# Patient Record
Sex: Female | Born: 1965
Health system: Southern US, Community
[De-identification: ages and names within clinical notes are randomized; demographics above are authoritative.]

## PROBLEM LIST (undated history)

## (undated) DIAGNOSIS — N189 Chronic kidney disease, unspecified: Secondary | ICD-10-CM

## (undated) DIAGNOSIS — D496 Neoplasm of unspecified behavior of brain: Secondary | ICD-10-CM

## (undated) DIAGNOSIS — E785 Hyperlipidemia, unspecified: Secondary | ICD-10-CM

## (undated) DIAGNOSIS — G4733 Obstructive sleep apnea (adult) (pediatric): Secondary | ICD-10-CM

## (undated) DIAGNOSIS — I10 Essential (primary) hypertension: Secondary | ICD-10-CM

## (undated) DIAGNOSIS — L570 Actinic keratosis: Secondary | ICD-10-CM

## (undated) DIAGNOSIS — C4492 Squamous cell carcinoma of skin, unspecified: Secondary | ICD-10-CM

## (undated) DIAGNOSIS — F419 Anxiety disorder, unspecified: Secondary | ICD-10-CM

## (undated) HISTORY — PX: CHOLECYSTECTOMY: SHX55

## (undated) HISTORY — DX: Squamous cell carcinoma of skin, unspecified: C44.92

## (undated) HISTORY — DX: Actinic keratosis: L57.0

## (undated) HISTORY — PX: WISDOM TOOTH EXTRACTION: SHX21

## (undated) HISTORY — PX: HX TUBAL LIGATION: SHX77

## (undated) HISTORY — DX: Hyperlipidemia, unspecified: E78.5

## (undated) HISTORY — PX: HX HYSTERECTOMY: SHX81

## (undated) HISTORY — PX: HX GALL BLADDER SURGERY/CHOLE: SHX55

## (undated) HISTORY — PX: BRAIN TUMOR EXCISION: SHX577

## (undated) HISTORY — PX: HX BREAST BIOPSY: SHX20

## (undated) HISTORY — DX: Obstructive sleep apnea (adult) (pediatric): G47.33

## (undated) HISTORY — DX: Neoplasm of unspecified behavior of brain: D49.6

## (undated) HISTORY — DX: Chronic kidney disease, unspecified: N18.9

## (undated) HISTORY — DX: Essential (primary) hypertension: I10

---

## 2000-06-21 ENCOUNTER — Other Ambulatory Visit: Admission: RE | Admit: 2000-06-21 | Discharge: 2000-06-21 | Payer: Self-pay | Admitting: *Deleted

## 2001-01-20 ENCOUNTER — Inpatient Hospital Stay (HOSPITAL_COMMUNITY): Admission: AD | Admit: 2001-01-20 | Discharge: 2001-01-22 | Payer: Self-pay | Admitting: *Deleted

## 2001-03-01 ENCOUNTER — Other Ambulatory Visit: Admission: RE | Admit: 2001-03-01 | Discharge: 2001-03-01 | Payer: Self-pay | Admitting: *Deleted

## 2001-03-14 ENCOUNTER — Ambulatory Visit (HOSPITAL_COMMUNITY): Payer: Self-pay

## 2003-08-29 ENCOUNTER — Inpatient Hospital Stay (HOSPITAL_COMMUNITY): Admission: AD | Admit: 2003-08-29 | Discharge: 2003-08-31 | Payer: Self-pay | Admitting: *Deleted

## 2003-09-28 ENCOUNTER — Encounter: Payer: Self-pay | Admitting: Family Medicine

## 2003-09-28 ENCOUNTER — Emergency Department (HOSPITAL_COMMUNITY): Admission: AD | Admit: 2003-09-28 | Discharge: 2003-09-28 | Payer: Self-pay | Admitting: Family Medicine

## 2003-09-28 ENCOUNTER — Ambulatory Visit (HOSPITAL_COMMUNITY): Admission: RE | Admit: 2003-09-28 | Discharge: 2003-09-28 | Payer: Self-pay | Admitting: Family Medicine

## 2003-10-02 ENCOUNTER — Other Ambulatory Visit: Admission: RE | Admit: 2003-10-02 | Discharge: 2003-10-02 | Payer: Self-pay | Admitting: *Deleted

## 2004-02-19 ENCOUNTER — Encounter (INDEPENDENT_AMBULATORY_CARE_PROVIDER_SITE_OTHER): Payer: Self-pay | Admitting: Specialist

## 2004-02-19 ENCOUNTER — Ambulatory Visit (HOSPITAL_COMMUNITY): Admission: RE | Admit: 2004-02-19 | Discharge: 2004-02-20 | Payer: Self-pay | Admitting: Surgery

## 2005-03-09 ENCOUNTER — Other Ambulatory Visit: Admission: RE | Admit: 2005-03-09 | Discharge: 2005-03-09 | Payer: Self-pay | Admitting: Obstetrics and Gynecology

## 2005-04-09 ENCOUNTER — Encounter: Admission: RE | Admit: 2005-04-09 | Discharge: 2005-04-09 | Payer: Self-pay | Admitting: Surgery

## 2005-04-14 ENCOUNTER — Ambulatory Visit: Payer: Self-pay | Admitting: Family Medicine

## 2005-05-24 ENCOUNTER — Ambulatory Visit: Payer: Self-pay | Admitting: Family Medicine

## 2009-11-27 ENCOUNTER — Inpatient Hospital Stay (HOSPITAL_COMMUNITY): Admission: AD | Admit: 2009-11-27 | Discharge: 2009-11-30 | Payer: Self-pay | Admitting: Obstetrics and Gynecology

## 2010-11-12 ENCOUNTER — Ambulatory Visit: Payer: Self-pay | Admitting: Family Medicine

## 2011-01-12 NOTE — Assessment & Plan Note (Signed)
Summary: FLU MIST/EVM  Nurse Visit   Immunizations Administered:  Influenza Vaccine:    Vaccine Type: FLULAVAL    Site: right deltoid    Mfr: GlaxoSmithKline    Dose: 0.5 ml    Route: IM    Given by: Levonne Spiller EMT-P    Exp. Date: 06/12/2011    Lot #: [id number]    VIS given: 07/07/10 version given November 12, 2010.   Immunizations Administered:  Influenza Vaccine:    Vaccine Type: FLULAVAL    Site: right deltoid    Mfr: GlaxoSmithKline    Dose: 0.5 ml    Route: IM    Given by: Levonne Spiller EMT-P    Exp. Date: 06/12/2011    Lot #: [id number]    VIS given: 07/07/10 version given November 12, 2010.  Flu Vaccine Consent Questions:    Do you have a history of severe allergic reactions to this vaccine? no    Any prior history of allergic reactions to egg and/or gelatin? no    Do you have a sensitivity to the preservative Thimersol? no    Do you have a past history of Guillan-Barre Syndrome? no    Do you currently have an acute febrile illness? no    Have you ever had a severe reaction to latex? no    Vaccine information given and explained to patient? yes    Are you currently pregnant? no

## 2011-03-15 LAB — COMPREHENSIVE METABOLIC PANEL
ALT: 13 U/L (ref 0–35)
AST: 16 U/L (ref 0–37)
Albumin: 3 g/dL — ABNORMAL LOW (ref 3.5–5.2)
Alkaline Phosphatase: 137 U/L — ABNORMAL HIGH (ref 39–117)
BUN: 12 mg/dL (ref 6–23)
CO2: 22 mEq/L (ref 19–32)
Calcium: 8.9 mg/dL (ref 8.4–10.5)
Chloride: 105 mEq/L (ref 96–112)
Creatinine, Ser: 0.64 mg/dL (ref 0.4–1.2)
GFR calc Af Amer: >60 mL/min (ref >60)
GFR calc non Af Amer: >60 mL/min (ref >60)
Glucose, Bld: 81 mg/dL (ref 70–99)
Potassium: 4.3 mEq/L (ref 3.5–5.1)
Sodium: 134 mEq/L — ABNORMAL LOW (ref 135–145)
Total Bilirubin: 0.3 mg/dL (ref 0.3–1.2)
Total Protein: 6.3 g/dL (ref 6.0–8.3)

## 2011-03-15 LAB — CBC
HCT: 33.9 % — ABNORMAL LOW (ref 36.0–46.0)
Hemoglobin: 11.2 g/dL — ABNORMAL LOW (ref 12.0–15.0)
Hemoglobin: 11.6 g/dL — ABNORMAL LOW (ref 12.0–15.0)
MCHC: 34.1 g/dL (ref 30.0–36.0)
MCHC: 34.5 g/dL (ref 30.0–36.0)
MCV: 99.3 fL (ref 78.0–100.0)
Platelets: 345 10*3/uL (ref 150–400)
RBC: 3.25 MIL/uL — ABNORMAL LOW (ref 3.87–5.11)
RBC: 3.41 MIL/uL — ABNORMAL LOW (ref 3.87–5.11)
RDW: 13 % (ref 11.5–15.5)
WBC: 15 10*3/uL — ABNORMAL HIGH (ref 4.0–10.5)

## 2011-03-15 LAB — URIC ACID: Uric Acid, Serum: 5 mg/dL (ref 2.4–7.0)

## 2011-03-15 LAB — LACTATE DEHYDROGENASE: LDH: 112 U/L (ref 94–250)

## 2011-03-15 LAB — RPR: RPR Ser Ql: NONREACTIVE

## 2011-04-30 NOTE — Op Note (Signed)
[facility] [place]  Patient:    Kathleen Hull, Kathleen Hull                 MRN: [id number] Adm. Date:  [id number] Attending:  Minette Headland                           Operative Report  DELIVERY NOTE  HISTORY:                      The patient had a second stage of between an hour and 45 minutes to two hours. She was known to be OA at +3 station with an epidural and stable fetal heart rate. Due to maternal exhaustion, the decision was made to proceed with VE assisted delivery.  DESCRIPTION OF PROCEDURE:     She was prepped and draped. The Foley catheter was removed. Tender touch VE was applied. Traction efforts were coordinated with maternal pushing x 3 efforts with pressure released in between. This resulted in easy delivery of the vertex over midline episiotomy with local and epidural. The shoulders were delivered easily; a healthy female, Apgars 9 and 9, pH pending. The placenta then delivered spontaneously, intact. Pitocin was given IV at that point. Inspection revealed the cervix and upper vagina to be intact. There was a partial third-degree extension. The rectal mucosa was intact. This was repaired in standard layer fashion with 3-0 Vicryl Rapide sutures. EBL was 350 to 400 cc. Mother and baby were doing well at that point. Of note, earlier during labor, catheterized UA was sent with no proteinuria noted. DD:  01/21/01 TD:  01/22/01 Job: [id number]

## 2011-04-30 NOTE — Op Note (Signed)
NAME:  Kathleen Hull, Kathleen Hull                         ACCOUNT NO.:  [id number]   MEDICAL RECORD NO.:  [id number]                   PATIENT TYPE:  OIB   LOCATION:  6120                                 FACILITY:  [facility]   PHYSICIAN:  Kathleen Hull, M.D.             DATE OF BIRTH:  1965/12/23   DATE OF PROCEDURE:  02/19/2004  DATE OF DISCHARGE:                                 OPERATIVE REPORT   PREOPERATIVE DIAGNOSIS:  Chronic cholecystitis and cholelithiasis.   POSTOPERATIVE DIAGNOSIS:  Chronic cholecystitis and cholelithiasis.   OPERATION PERFORMED:  Laparoscopic cholecystectomy with intraoperative  cholangiogram.   SURGEON:  Kathleen Hull, M.D.   ASSISTANT:  Kathleen Hull, M.D.   ANESTHESIA:  General endotracheal.   DESCRIPTION OF PROCEDURE:  Kathleen Hull was taken to room 16 on February 19, 2004 and given general anesthesia.  The abdomen was prepped with Betadine  and draped sterilely.  Longitudinal incision was made down to the umbilicus  and we dissected down onto the fascia and opened this with a linear incision  without difficulty, placing the Hasson cannula.  The abdomen was  insufflated.  The other port sites were injected with Marcaine and entered  without difficulty.  The gallbladder was grasped and elevated.  She had  numerous chronic adhesions to the infundibulum as well as the fundus of the  gallbladder which were stripped away.  I then dissected out the Calot's  triangle and found the beginning of the cystic duct which seemed to be  fairly long.  I stayed up on the gallbladder however, and dissected that  free and then put a clip upon the gallbladder and took a cholangiogram the  first time with some extravasation.  I had to replace my catheter and put a  clip upon it and then got a good view of the common duct with intrahepatic  filling and prompt flow into the duodenum.  No evidence of stones.  Next, I  triple clipped the cystic duct, divided it and then clipped  the cystic  artery twice and divided it.  I got some back-bleeding.  I then moved up the  gallbladder bed using the hook without entering the gallbladder and  maintained hemostasis all the way up to the top.  There was some oozing down  where I had clipped the arteries.  I went back and put a couple of more  clips and cauterized the area and it seemed to control the bleeding and no  further bleeding was noted.  The gallbladder was detached and brought out  through the umbilicus.  I had to remove the stones which were many and  multifaceted but got it out.  There was no spillage of bile or stones in the  process of removing the gallbladder.  I then went back up and irrigated the  gallbladder bed and looked at it and no bleeding was noted and no bile leaks  were seen.  Irrigant was withdrawn.  I then repaired the umbilical defect  with a simple suture that I placed plus a figure-of-eight suture down below.  These were tied and I completed the closure.  We visualized this with the  scope in  place.  I then visualized this.  The two 5 mm ports were withdrawn.  The  abdomen was deflated.  The port sites were injected with Marcaine and were  closed with 4-0 Vicryl, benzoin and Steri-Strips.  The patient seemed to  tolerate the procedure well and was taken to the recovery room in  satisfactory condition.                                               Kathleen Park Daphine Hull, M.D.    MBM/MEDQ  D:  02/19/2004  T:  02/19/2004  Job:  [id number]   cc:   Kathleen Hull, M.D.  [address] [place]  [place] [address]  Fax: [phone]   [facility]

## 2011-12-14 DIAGNOSIS — C4492 Squamous cell carcinoma of skin, unspecified: Secondary | ICD-10-CM

## 2011-12-14 HISTORY — DX: Squamous cell carcinoma of skin, unspecified: C44.92

## 2012-06-24 LAB — HM PAP SMEAR: HM Pap smear: NORMAL

## 2013-02-22 ENCOUNTER — Encounter: Payer: Self-pay | Admitting: Internal Medicine

## 2013-02-22 ENCOUNTER — Ambulatory Visit (INDEPENDENT_AMBULATORY_CARE_PROVIDER_SITE_OTHER): Payer: Managed Care, Other (non HMO) | Admitting: Internal Medicine

## 2013-02-22 VITALS — BP 124/90 | HR 75 | Temp 98.2°F | Ht 64.0 in | Wt 188.0 lb

## 2013-02-22 DIAGNOSIS — Z1239 Encounter for other screening for malignant neoplasm of breast: Secondary | ICD-10-CM | POA: Insufficient documentation

## 2013-02-22 DIAGNOSIS — E669 Obesity, unspecified: Secondary | ICD-10-CM | POA: Insufficient documentation

## 2013-02-22 DIAGNOSIS — J069 Acute upper respiratory infection, unspecified: Secondary | ICD-10-CM | POA: Insufficient documentation

## 2013-02-22 NOTE — Assessment & Plan Note (Signed)
Will set up screening mammogram. 

## 2013-02-22 NOTE — Assessment & Plan Note (Signed)
Symptoms most consistent with viral upper respiratory infection. Encourage to rest, increase fluids, and use of nonsedating antihistamine such as Claritin. Sudafed as needed for nasal congestion. Patient will call if symptoms are worsening or not improving over the next 48 hours.

## 2013-02-22 NOTE — Progress Notes (Signed)
Subjective:    Patient ID: Kathleen Hull, female    DOB: [birth year], 47 y.o.   MRN: [id number]  HPI 47 year old female presents to establish care. She reports she has been generally healthy. Over the last couple of days she has had some clear nasal congestion, and occasional dry cough. She denies any fever or chills. She denies any shortness of breath or chest pain. She has not been taking any medication for this.  She would like to schedule screening mammogram. She reports that after she completed breast-feeding with her third child she did not schedule screening mammogram. This will be her first mammogram. She denies any pain in her breast or concerns.  She follows a healthy diet and has recently lost >40lbs by following healthy diet and getting regular exercise by walking. She notes that she would like to lose another 10-20lbs.  No outpatient encounter prescriptions on file as of 02/22/2013.   No facility-administered encounter medications on file as of 02/22/2013.   BP 124/90  Pulse 75  Temp(Src) 98.2 F (36.8 C) (Oral)  Ht 5\' 4"  (1.626 m)  Wt 188 lb (85.276 kg)  BMI 32.25 kg/m2  SpO2 99%  LMP 01/30/2013  Review of Systems  Constitutional: Negative for fever, chills, appetite change, fatigue and unexpected weight change.  HENT: Positive for rhinorrhea and postnasal drip. Negative for ear pain, congestion, sore throat, trouble swallowing, neck pain, voice change and sinus pressure.   Eyes: Negative for visual disturbance.  Respiratory: Positive for cough. Negative for shortness of breath, wheezing and stridor.   Cardiovascular: Negative for chest pain, palpitations and leg swelling.  Gastrointestinal: Negative for nausea, vomiting, abdominal pain, diarrhea, constipation, blood in stool, abdominal distention and anal bleeding.  Genitourinary: Negative for dysuria and flank pain.  Musculoskeletal: Negative for myalgias, arthralgias and gait problem.  Skin: Negative for color change  and rash.  Neurological: Negative for dizziness and headaches.  Hematological: Negative for adenopathy. Does not bruise/bleed easily.  Psychiatric/Behavioral: Negative for suicidal ideas, sleep disturbance and dysphoric mood. The patient is not nervous/anxious.        Objective:   Physical Exam  Constitutional: She is oriented to person, place, and time. She appears well-developed and well-nourished. No distress.  HENT:  Head: Normocephalic and atraumatic.  Right Ear: External ear normal.  Left Ear: External ear normal.  Nose: Nose normal.  Mouth/Throat: Posterior oropharyngeal erythema present. No oropharyngeal exudate.  Eyes: Conjunctivae are normal. Pupils are equal, round, and reactive to light. Right eye exhibits no discharge. Left eye exhibits no discharge. No scleral icterus.  Neck: Normal range of motion. Neck supple. No tracheal deviation present. No thyromegaly present.  Cardiovascular: Normal rate, regular rhythm, normal heart sounds and intact distal pulses.  Exam reveals no gallop and no friction rub.   No murmur heard. Pulmonary/Chest: Effort normal and breath sounds normal. No respiratory distress. She has no wheezes. She has no rales. She exhibits no tenderness.  Abdominal: Soft. Bowel sounds are normal. She exhibits no distension. There is no tenderness.  Musculoskeletal: Normal range of motion. She exhibits no edema and no tenderness.  Lymphadenopathy:    She has no cervical adenopathy.  Neurological: She is alert and oriented to person, place, and time. No cranial nerve deficit. She exhibits normal muscle tone. Coordination normal.  Skin: Skin is warm and dry. No rash noted. She is not diaphoretic. No erythema. No pallor.  Psychiatric: She has a normal mood and affect. Her behavior is normal. Judgment and thought content  normal.          Assessment & Plan:

## 2013-02-22 NOTE — Assessment & Plan Note (Signed)
Body mass index is 32.25 kg/(m^2). Congratulated patient on recent weight loss. Encouraged her to continue efforts at healthy diet and regular physical activity.

## 2013-03-19 ENCOUNTER — Ambulatory Visit
Admission: RE | Admit: 2013-03-19 | Discharge: 2013-03-19 | Disposition: A | Discharge status: Home / Self Care | Payer: Managed Care, Other (non HMO) | Source: Ambulatory Visit | Attending: Internal Medicine | Admitting: Internal Medicine

## 2013-03-19 DIAGNOSIS — Z1239 Encounter for other screening for malignant neoplasm of breast: Secondary | ICD-10-CM

## 2013-03-20 ENCOUNTER — Encounter: Payer: Self-pay | Admitting: *Deleted

## 2013-05-09 ENCOUNTER — Encounter: Payer: Self-pay | Admitting: Internal Medicine

## 2013-05-09 ENCOUNTER — Telehealth: Payer: Self-pay | Admitting: Internal Medicine

## 2013-05-09 NOTE — Telephone Encounter (Signed)
Patient Information:  Caller Name: Jerolene  Phone: [phone]  Patient: Kathleen Hull, Kathleen Hull  Gender: Female  DOB: [birth year]  Age: 47 Years  PCP: Ronna Polio (Adults only)  Pregnant: No  Office Follow Up:  Does the office need to follow up with this patient?: No  Instructions For The Office: N/A   Symptoms  Reason For Call & Symptoms: pt reports that she went to pick strawberries the same day.  Rash is on chest and beginning to spread to arms  Reviewed Health History In EMR: Yes  Reviewed Medications In EMR: Yes  Reviewed Allergies In EMR: Yes  Reviewed Surgeries / Procedures: Yes  Date of Onset of Symptoms: 05/07/2013  Treatments Tried: Benadryl, Hydrocortisone  Treatments Tried Worked: No OB / GYN:  LMP: 05/02/2013  Guideline(s) Used:  Rash or Redness - Widespread  Disposition Per Guideline:   See Today or Tomorrow in Office  Reason For Disposition Reached:   Mild widespread rash  Advice Given:  Oatmeal Aveeno Bath for Itching:  Sprinkle contents of one Aveeno packet under running faucet with comfortably warm water. Bathe for 15 - 20 minutes, 1-2 times daily.  Oral Antihistamine Medication for Itching:  Take an antihistamine like diphenhydramine (Benadryl) for widespread rashes that itch. The adult dosage of Benadryl is 25-50 mg by mouth 4 times daily.  Call Back If:   You become worse  Patient Will Follow Care Advice:  YES  Appointment Scheduled:  05/10/2013 09:45:00 Appointment Scheduled Provider:  Ronna Polio (Adults only)

## 2013-05-09 NOTE — Telephone Encounter (Signed)
Fwd to Dr. Walker 

## 2013-05-10 ENCOUNTER — Ambulatory Visit: Payer: Self-pay | Admitting: Internal Medicine

## 2013-06-06 LAB — HM PAP SMEAR: HM Pap smear: NEGATIVE

## 2013-07-27 ENCOUNTER — Encounter: Payer: Self-pay | Admitting: Internal Medicine

## 2013-07-30 ENCOUNTER — Encounter: Payer: Managed Care, Other (non HMO) | Admitting: Internal Medicine

## 2013-08-21 ENCOUNTER — Encounter: Payer: Managed Care, Other (non HMO) | Admitting: Internal Medicine

## 2013-08-29 ENCOUNTER — Encounter: Payer: Managed Care, Other (non HMO) | Admitting: Internal Medicine

## 2013-09-11 ENCOUNTER — Encounter: Payer: Self-pay | Admitting: Internal Medicine

## 2013-09-11 ENCOUNTER — Ambulatory Visit (INDEPENDENT_AMBULATORY_CARE_PROVIDER_SITE_OTHER): Payer: Managed Care, Other (non HMO) | Admitting: Internal Medicine

## 2013-09-11 ENCOUNTER — Other Ambulatory Visit (HOSPITAL_COMMUNITY)
Admission: RE | Admit: 2013-09-11 | Discharge: 2013-09-11 | Disposition: A | Discharge status: Home / Self Care | Payer: Managed Care, Other (non HMO) | Source: Ambulatory Visit | Attending: Internal Medicine | Admitting: Internal Medicine

## 2013-09-11 VITALS — BP 120/80 | HR 56 | Temp 98.3°F | Ht 64.0 in | Wt 175.0 lb

## 2013-09-11 DIAGNOSIS — Z01419 Encounter for gynecological examination (general) (routine) without abnormal findings: Secondary | ICD-10-CM | POA: Insufficient documentation

## 2013-09-11 DIAGNOSIS — Z1151 Encounter for screening for human papillomavirus (HPV): Secondary | ICD-10-CM | POA: Insufficient documentation

## 2013-09-11 DIAGNOSIS — Z0000 Encounter for general adult medical examination without abnormal findings: Secondary | ICD-10-CM | POA: Insufficient documentation

## 2013-09-11 DIAGNOSIS — N3941 Urge incontinence: Secondary | ICD-10-CM | POA: Insufficient documentation

## 2013-09-11 DIAGNOSIS — Z23 Encounter for immunization: Secondary | ICD-10-CM

## 2013-09-11 DIAGNOSIS — C44529 Squamous cell carcinoma of skin of other part of trunk: Secondary | ICD-10-CM | POA: Insufficient documentation

## 2013-09-11 NOTE — Progress Notes (Signed)
Subjective:    Patient ID: MERLIN EGE, female    DOB: [birth year], 47 y.o.   MRN: [id number]  HPI 47YO female presents for annual exam. Her primary concern today is pain at recent excision site of SCC over her lower sternum. This was performed 2 weeks ago by her dermatologist. She reports that she had significant bleeding from the site after the procedure, soaking through several pads. She was able to stop the bleeding with an over-the-counter coagulant. She also reports significant pain at the site. She has been applying Vaseline daily. She denies any fever or chills. She would like to get her sutures removed today.  Aside from this, she is doing well. She notes some variation in her menstrual cycles with lighter cycles alternating with heavier cycles. She also notes some occasional urinary urgency and leakage of small amounts of urine. She has been wearing a small pad to help with this. She denies any dysuria, hematuria, fever, chills, flank pain.  No outpatient encounter prescriptions on file as of 09/11/2013.   No facility-administered encounter medications on file as of 09/11/2013.   BP 120/80  Pulse 56  Temp(Src) 98.3 F (36.8 C) (Oral)  Ht 5\' 4"  (1.626 m)  Wt 175 lb (79.379 kg)  BMI 30.02 kg/m2  SpO2 97%  LMP 09/04/2013  Review of Systems  Constitutional: Negative for fever, chills, appetite change, fatigue and unexpected weight change.  HENT: Negative for ear pain, congestion, sore throat, trouble swallowing, neck pain, voice change and sinus pressure.   Eyes: Negative for visual disturbance.  Respiratory: Negative for cough, shortness of breath, wheezing and stridor.   Cardiovascular: Positive for chest pain (chest wall pain). Negative for palpitations and leg swelling.  Gastrointestinal: Negative for nausea, vomiting, abdominal pain, diarrhea, constipation, blood in stool, abdominal distention and anal bleeding.  Genitourinary: Positive for urgency. Negative for dysuria and  flank pain.  Musculoskeletal: Negative for myalgias, arthralgias and gait problem.  Skin: Negative for color change and rash.  Neurological: Negative for dizziness and headaches.  Hematological: Negative for adenopathy. Does not bruise/bleed easily.  Psychiatric/Behavioral: Negative for suicidal ideas, sleep disturbance and dysphoric mood. The patient is not nervous/anxious.        Objective:   Physical Exam  Constitutional: She is oriented to person, place, and time. She appears well-developed and well-nourished. No distress.  HENT:  Head: Normocephalic and atraumatic.  Right Ear: External ear normal.  Left Ear: External ear normal.  Nose: Nose normal.  Mouth/Throat: Oropharynx is clear and moist. No oropharyngeal exudate.  Eyes: Conjunctivae are normal. Pupils are equal, round, and reactive to light. Right eye exhibits no discharge. Left eye exhibits no discharge. No scleral icterus.  Neck: Normal range of motion. Neck supple. No tracheal deviation present. No thyromegaly present.  Cardiovascular: Normal rate, regular rhythm, normal heart sounds and intact distal pulses.  Exam reveals no gallop and no friction rub.   No murmur heard. Pulmonary/Chest: Effort normal and breath sounds normal. No accessory muscle usage. Not tachypneic. No respiratory distress. She has no decreased breath sounds. She has no wheezes. She has no rhonchi. She has no rales. She exhibits no tenderness.    Abdominal: Soft. Bowel sounds are normal. She exhibits no distension and no mass. There is no tenderness. There is no rebound and no guarding.  Genitourinary: Rectum normal, vagina normal and uterus normal. No breast swelling, tenderness, discharge or bleeding. Pelvic exam was performed with patient supine. There is no rash, tenderness or lesion on the  right labia. There is no rash, tenderness or lesion on the left labia. Uterus is not enlarged and not tender. Cervix exhibits no motion tenderness, no discharge and  no friability. Right adnexum displays no mass, no tenderness and no fullness. Left adnexum displays no mass, no tenderness and no fullness. No erythema or tenderness around the vagina. No vaginal discharge found.  Musculoskeletal: Normal range of motion. She exhibits no edema and no tenderness.  Lymphadenopathy:    She has no cervical adenopathy.  Neurological: She is alert and oriented to person, place, and time. No cranial nerve deficit. She exhibits normal muscle tone. Coordination normal.  Skin: Skin is warm and dry. No rash noted. She is not diaphoretic. No erythema. No pallor.  Psychiatric: She has a normal mood and affect. Her behavior is normal. Judgment and thought content normal.          Assessment & Plan:

## 2013-09-11 NOTE — Assessment & Plan Note (Signed)
S/p resection with Dr. Cheree Ditto. Will request notes on this. Sutures removed today. Persistent pain at site. Site is macerated. Encouraged pt to keep area dry. Applied Telfa dressing. Follow up prn. Will also set up dermatology evaluation for overall skin check.

## 2013-09-11 NOTE — Assessment & Plan Note (Signed)
General medical exam including breast and pelvic exam normal today. PAP is pending. Will check labs including CBC, CMP, lipids, TSH. Encouraged healthy diet and regular physical activity. Mammogram ordered. Follow up 1 year and prn.

## 2013-09-11 NOTE — Assessment & Plan Note (Signed)
Symptoms consistent with urge incontinence. Discussed medication to help control symptoms. Pt would prefer to hold off for now. Will continue to monitor.

## 2014-09-27 ENCOUNTER — Other Ambulatory Visit: Payer: Self-pay

## 2015-07-07 ENCOUNTER — Encounter: Payer: Self-pay | Admitting: Internal Medicine

## 2015-07-07 ENCOUNTER — Ambulatory Visit (INDEPENDENT_AMBULATORY_CARE_PROVIDER_SITE_OTHER): Payer: Managed Care, Other (non HMO) | Admitting: Internal Medicine

## 2015-07-07 ENCOUNTER — Other Ambulatory Visit: Payer: Self-pay | Admitting: Internal Medicine

## 2015-07-07 VITALS — BP 119/83 | HR 75 | Temp 97.9°F | Ht 64.25 in | Wt 224.2 lb

## 2015-07-07 DIAGNOSIS — Z0000 Encounter for general adult medical examination without abnormal findings: Secondary | ICD-10-CM | POA: Diagnosis not present

## 2015-07-07 DIAGNOSIS — E669 Obesity, unspecified: Secondary | ICD-10-CM | POA: Diagnosis not present

## 2015-07-07 DIAGNOSIS — N951 Menopausal and female climacteric states: Secondary | ICD-10-CM | POA: Diagnosis not present

## 2015-07-07 DIAGNOSIS — Z1231 Encounter for screening mammogram for malignant neoplasm of breast: Secondary | ICD-10-CM

## 2015-07-07 LAB — CBC WITH DIFFERENTIAL/PLATELET
Basophils Absolute: 0 10*3/uL (ref 0.0–0.1)
Basophils Relative: 0.4 % (ref 0.0–3.0)
Eosinophils Absolute: 0.1 10*3/uL (ref 0.0–0.7)
Eosinophils Relative: 1.7 % (ref 0.0–5.0)
HCT: 38.6 % (ref 36.0–46.0)
HEMOGLOBIN: 13 g/dL (ref 12.0–15.0)
LYMPHS ABS: 2.3 10*3/uL (ref 0.7–4.0)
LYMPHS PCT: 27 % (ref 12.0–46.0)
MCHC: 33.8 g/dL (ref 30.0–36.0)
MCV: 96 fl (ref 78.0–100.0)
MONO ABS: 0.5 10*3/uL (ref 0.1–1.0)
Monocytes Relative: 5.2 % (ref 3.0–12.0)
Neutro Abs: 5.7 10*3/uL (ref 1.4–7.7)
Neutrophils Relative %: 65.7 % (ref 43.0–77.0)
Platelets: 259 10*3/uL (ref 150.0–400.0)
RBC: 4.02 Mil/uL (ref 3.87–5.11)
RDW: 12.8 % (ref 11.5–15.5)
WBC: 8.6 10*3/uL (ref 4.0–10.5)

## 2015-07-07 LAB — TSH: TSH: 4.71 u[IU]/mL — ABNORMAL HIGH (ref 0.35–4.50)

## 2015-07-07 LAB — COMPREHENSIVE METABOLIC PANEL
ALK PHOS: 77 U/L (ref 39–117)
ALT: 15 U/L (ref 0–35)
AST: 13 U/L (ref 0–37)
Albumin: 3.9 g/dL (ref 3.5–5.2)
BUN: 10 mg/dL (ref 6–23)
CO2: 27 mEq/L (ref 19–32)
CREATININE: 0.82 mg/dL (ref 0.40–1.20)
Calcium: 8.9 mg/dL (ref 8.4–10.5)
Chloride: 105 mEq/L (ref 96–112)
GFR: 79.55 mL/min (ref >60.00)
Glucose, Bld: 84 mg/dL (ref 70–99)
Potassium: 4.7 mEq/L (ref 3.5–5.1)
SODIUM: 139 meq/L (ref 135–145)
Total Bilirubin: 0.6 mg/dL (ref 0.2–1.2)
Total Protein: 6.5 g/dL (ref 6.0–8.3)

## 2015-07-07 LAB — LIPID PANEL
CHOL/HDL RATIO: 4
CHOLESTEROL: 179 mg/dL (ref 0–200)
HDL: 40.4 mg/dL (ref >39.00)
LDL Cholesterol: 116 mg/dL — ABNORMAL HIGH (ref 0–99)
NONHDL: 138.6
TRIGLYCERIDES: 112 mg/dL (ref 0.0–149.0)
VLDL: 22.4 mg/dL (ref 0.0–40.0)

## 2015-07-07 LAB — HEMOGLOBIN A1C: HEMOGLOBIN A1C: 4.8 % (ref 4.6–6.5)

## 2015-07-07 LAB — MICROALBUMIN / CREATININE URINE RATIO
CREATININE, U: 275.9 mg/dL
MICROALB UR: 1.7 mg/dL (ref 0.0–1.9)
Microalb Creat Ratio: 0.6 mg/g (ref 0.0–30.0)

## 2015-07-07 LAB — VITAMIN D 25 HYDROXY (VIT D DEFICIENCY, FRACTURES): VITD: 22.16 ng/mL — ABNORMAL LOW (ref 30.00–100.00)

## 2015-07-07 MED ORDER — BUPROPION HCL ER (XL) 150 MG PO TB24
150.0000 mg | ORAL_TABLET | Freq: Every day | ORAL | Status: DC
Start: 2015-07-07 — End: 2015-08-05

## 2015-07-07 NOTE — Patient Instructions (Addendum)
Start Wellbutrin $RemoveBeforeDE'150mg'[id number]$  daily in the morning to help with increased anxiety.  Follow up in 4 weeks or sooner as needed.  Health Maintenance Adopting a healthy lifestyle and getting preventive care can go a long way to promote health and wellness. Talk with your health care provider about what schedule of regular examinations is right for you. This is a good chance for you to check in with your provider about disease prevention and staying healthy. In between checkups, there are plenty of things you can do on your own. Experts have done a lot of research about which lifestyle changes and preventive measures are most likely to keep you healthy. Ask your health care provider for more information. WEIGHT AND DIET  Eat a healthy diet  Be sure to include plenty of vegetables, fruits, low-fat dairy products, and lean protein.  Do not eat a lot of foods high in solid fats, added sugars, or salt.  Get regular exercise. This is one of the most important things you can do for your health.  Most adults should exercise for at least 150 minutes each week. The exercise should increase your heart rate and make you sweat (moderate-intensity exercise).  Most adults should also do strengthening exercises at least twice a week. This is in addition to the moderate-intensity exercise.  Maintain a healthy weight  Body mass index (BMI) is a measurement that can be used to identify possible weight problems. It estimates body fat based on height and weight. Your health care provider can help determine your BMI and help you achieve or maintain a healthy weight.  For females 92 years of age and older:   A BMI below 18.5 is considered underweight.  A BMI of 18.5 to 24.9 is normal.  A BMI of 25 to 29.9 is considered overweight.  A BMI of 30 and above is considered obese.  Watch levels of cholesterol and blood lipids  You should start having your blood tested for lipids and cholesterol at 47 years of age, then  have this test every 5 years.  You may need to have your cholesterol levels checked more often if:  Your lipid or cholesterol levels are high.  You are older than 47 years of age.  You are at high risk for heart disease.  CANCER SCREENING   Lung Cancer  Lung cancer screening is recommended for adults 67-47 years old who are at high risk for lung cancer because of a history of smoking.  A yearly low-dose CT scan of the lungs is recommended for people who:  Currently smoke.  Have quit within the past 15 years.  Have at least a 30-pack-year history of smoking. A pack year is smoking an average of one pack of cigarettes a day for 1 year.  Yearly screening should continue until it has been 15 years since you quit.  Yearly screening should stop if you develop a health problem that would prevent you from having lung cancer treatment.  Breast Cancer  Practice breast self-awareness. This means understanding how your breasts normally appear and feel.  It also means doing regular breast self-exams. Let your health care provider know about any changes, no matter how small.  If you are in your 20s or 30s, you should have a clinical breast exam (CBE) by a health care provider every 1-3 years as part of a regular health exam.  If you are 1 or older, have a CBE every year. Also consider having a breast X-ray (mammogram) every year.  If you have a family history of breast cancer, talk to your health care provider about genetic screening.  If you are at high risk for breast cancer, talk to your health care provider about having an MRI and a mammogram every year.  Breast cancer gene (BRCA) assessment is recommended for women who have family members with BRCA-related cancers. BRCA-related cancers include:  Breast.  Ovarian.  Tubal.  Peritoneal cancers.  Results of the assessment will determine the need for genetic counseling and BRCA1 and BRCA2 testing. Cervical Cancer Routine  pelvic examinations to screen for cervical cancer are no longer recommended for nonpregnant women who are considered low risk for cancer of the pelvic organs (ovaries, uterus, and vagina) and who do not have symptoms. A pelvic examination may be necessary if you have symptoms including those associated with pelvic infections. Ask your health care provider if a screening pelvic exam is right for you.   The Pap test is the screening test for cervical cancer for women who are considered at risk.  If you had a hysterectomy for a problem that was not cancer or a condition that could lead to cancer, then you no longer need Pap tests.  If you are older than 65 years, and you have had normal Pap tests for the past 10 years, you no longer need to have Pap tests.  If you have had past treatment for cervical cancer or a condition that could lead to cancer, you need Pap tests and screening for cancer for at least 20 years after your treatment.  If you no longer get a Pap test, assess your risk factors if they change (such as having a new sexual partner). This can affect whether you should start being screened again.  Some women have medical problems that increase their chance of getting cervical cancer. If this is the case for you, your health care provider may recommend more frequent screening and Pap tests.  The human papillomavirus (HPV) test is another test that may be used for cervical cancer screening. The HPV test looks for the virus that can cause cell changes in the cervix. The cells collected during the Pap test can be tested for HPV.  The HPV test can be used to screen women 34 years of age and older. Getting tested for HPV can extend the interval between normal Pap tests from three to five years.  An HPV test also should be used to screen women of any age who have unclear Pap test results.  After 47 years of age, women should have HPV testing as often as Pap tests.  Colorectal Cancer  This  type of cancer can be detected and often prevented.  Routine colorectal cancer screening usually begins at 47 years of age and continues through 47 years of age.  Your health care provider may recommend screening at an earlier age if you have risk factors for colon cancer.  Your health care provider may also recommend using home test kits to check for hidden blood in the stool.  A small camera at the end of a tube can be used to examine your colon directly (sigmoidoscopy or colonoscopy). This is done to check for the earliest forms of colorectal cancer.  Routine screening usually begins at age 48.  Direct examination of the colon should be repeated every 5-10 years through 47 years of age. However, you may need to be screened more often if early forms of precancerous polyps or small growths are found. Skin Cancer  Check your skin from head to toe regularly.  Tell your health care provider about any new moles or changes in moles, especially if there is a change in a mole's shape or color.  Also tell your health care provider if you have a mole that is larger than the size of a pencil eraser.  Always use sunscreen. Apply sunscreen liberally and repeatedly throughout the day.  Protect yourself by wearing long sleeves, pants, a wide-brimmed hat, and sunglasses whenever you are outside. HEART DISEASE, DIABETES, AND HIGH BLOOD PRESSURE   Have your blood pressure checked at least every 1-2 years. High blood pressure causes heart disease and increases the risk of stroke.  If you are between 39 years and 50 years old, ask your health care provider if you should take aspirin to prevent strokes.  Have regular diabetes screenings. This involves taking a blood sample to check your fasting blood sugar level.  If you are at a normal weight and have a low risk for diabetes, have this test once every three years after 47 years of age.  If you are overweight and have a high risk for diabetes,  consider being tested at a younger age or more often. PREVENTING INFECTION  Hepatitis B  If you have a higher risk for hepatitis B, you should be screened for this virus. You are considered at high risk for hepatitis B if:  You were born in a country where hepatitis B is common. Ask your health care provider which countries are considered high risk.  Your parents were born in a high-risk country, and you have not been immunized against hepatitis B (hepatitis B vaccine).  You have HIV or AIDS.  You use needles to inject street drugs.  You live with someone who has hepatitis B.  You have had sex with someone who has hepatitis B.  You get hemodialysis treatment.  You take certain medicines for conditions, including cancer, organ transplantation, and autoimmune conditions. Hepatitis C  Blood testing is recommended for:  Everyone born from [birth year] through 1963.  Anyone with known risk factors for hepatitis C. Sexually transmitted infections (STIs)  You should be screened for sexually transmitted infections (STIs) including gonorrhea and chlamydia if:  You are sexually active and are younger than 47 years of age.  You are older than 47 years of age and your health care provider tells you that you are at risk for this type of infection.  Your sexual activity has changed since you were last screened and you are at an increased risk for chlamydia or gonorrhea. Ask your health care provider if you are at risk.  If you do not have HIV, but are at risk, it may be recommended that you take a prescription medicine daily to prevent HIV infection. This is called pre-exposure prophylaxis (PrEP). You are considered at risk if:  You are sexually active and do not regularly use condoms or know the HIV status of your partner(s).  You take drugs by injection.  You are sexually active with a partner who has HIV. Talk with your health care provider about whether you are at high risk of being  infected with HIV. If you choose to begin PrEP, you should first be tested for HIV. You should then be tested every 3 months for as long as you are taking PrEP.  PREGNANCY   If you are premenopausal and you may become pregnant, ask your health care provider about preconception counseling.  If you may become pregnant,  take 400 to 800 micrograms (mcg) of folic acid every day.  If you want to prevent pregnancy, talk to your health care provider about birth control (contraception). OSTEOPOROSIS AND MENOPAUSE   Osteoporosis is a disease in which the bones lose minerals and strength with aging. This can result in serious bone fractures. Your risk for osteoporosis can be identified using a bone density scan.  If you are 39 years of age or older, or if you are at risk for osteoporosis and fractures, ask your health care provider if you should be screened.  Ask your health care provider whether you should take a calcium or vitamin D supplement to lower your risk for osteoporosis.  Menopause may have certain physical symptoms and risks.  Hormone replacement therapy may reduce some of these symptoms and risks. Talk to your health care provider about whether hormone replacement therapy is right for you.  HOME CARE INSTRUCTIONS   Schedule regular health, dental, and eye exams.  Stay current with your immunizations.   Do not use any tobacco products including cigarettes, chewing tobacco, or electronic cigarettes.  If you are pregnant, do not drink alcohol.  If you are breastfeeding, limit how much and how often you drink alcohol.  Limit alcohol intake to no more than 1 drink per day for nonpregnant women. One drink equals 12 ounces of beer, 5 ounces of wine, or 1 ounces of hard liquor.  Do not use street drugs.  Do not share needles.  Ask your health care provider for help if you need support or information about quitting drugs.  Tell your health care provider if you often feel  depressed.  Tell your health care provider if you have ever been abused or do not feel safe at home. Document Released: 06/14/2011 Document Revised: 04/15/2014 Document Reviewed: 10/31/2013 [facility] Patient Information 2015 [place], [place]. This information is not intended to replace advice given to you by your health care provider. Make sure you discuss any questions you have with your health care provider.

## 2015-07-07 NOTE — Assessment & Plan Note (Addendum)
Recent worsening menopausal symptoms. Will start Wellbutrin 150mg  daily. Discussed potential benefits and risks of this medication. Follow up in 4 weeks.

## 2015-07-07 NOTE — Progress Notes (Signed)
Pre visit review using our clinic review tool, if applicable. No additional management support is needed unless otherwise documented below in the visit note. 

## 2015-07-07 NOTE — Addendum Note (Signed)
Addended by: Johnsie Cancel on: 07/07/2015 09:35 AM   Modules accepted: Miquel Dunn

## 2015-07-07 NOTE — Assessment & Plan Note (Signed)
Wt Readings from Last 3 Encounters:  07/07/15 224 lb 4 oz (101.719 kg)  09/11/13 175 lb (79.379 kg)  02/22/13 188 lb (85.276 kg)   Body mass index is 38.19 kg/(m^2). Encouraged healthy diet and exercise with goal of weight loss.

## 2015-07-07 NOTE — Assessment & Plan Note (Signed)
General medical exam normal today including breast exam. PAP and pelvic deferred as normal in 2014, HPV neg. Mammogram ordered. Labs as ordered. Encouraged healthy diet and exercise. Immunizations are UTD.

## 2015-07-07 NOTE — Progress Notes (Signed)
Subjective:    Patient ID: Kathleen Hull, female    DOB: Apr 21, 1966, 49 y.o.   MRN: [id number]  HPI  49YO female presents for annual exam. Last seen 2014 for annual exam.  She notes some increased anxiety and irritability recently. She also notes irregular light menstrual cycles. No hot flashes. Aside from this, feeling well.    Past medical, surgical, family and social history per today's encounter.  Review of Systems  Constitutional: Negative for fever, chills, appetite change, fatigue and unexpected weight change.  Eyes: Negative for visual disturbance.  Respiratory: Negative for shortness of breath.   Cardiovascular: Negative for chest pain and leg swelling.  Gastrointestinal: Negative for nausea, vomiting, abdominal pain, diarrhea, constipation and blood in stool.  Musculoskeletal: Negative for myalgias and arthralgias.  Skin: Negative for color change and rash.  Hematological: Negative for adenopathy. Does not bruise/bleed easily.  Psychiatric/Behavioral: Positive for sleep disturbance and dysphoric mood. The patient is nervous/anxious.        Objective:    BP 119/83 mmHg  Pulse 75  Temp(Src) 97.9 F (36.6 C) (Oral)  Ht 5' 4.25" (1.632 m)  Wt 224 lb 4 oz (101.719 kg)  BMI 38.19 kg/m2  SpO2 100%  LMP 06/20/2015 Physical Exam  Constitutional: She is oriented to person, place, and time. She appears well-developed and well-nourished. No distress.  HENT:  Head: Normocephalic and atraumatic.  Right Ear: External ear normal.  Left Ear: External ear normal.  Nose: Nose normal.  Mouth/Throat: Oropharynx is clear and moist. No oropharyngeal exudate.  Eyes: Conjunctivae are normal. Pupils are equal, round, and reactive to light. Right eye exhibits no discharge. Left eye exhibits no discharge. No scleral icterus.  Neck: Normal range of motion. Neck supple. No tracheal deviation present. No thyromegaly present.  Cardiovascular: Normal rate, regular rhythm, normal heart  sounds and intact distal pulses.  Exam reveals no gallop and no friction rub.   No murmur heard. Pulmonary/Chest: Effort normal and breath sounds normal. No accessory muscle usage. No tachypnea. No respiratory distress. She has no decreased breath sounds. She has no wheezes. She has no rales. She exhibits no tenderness. Right breast exhibits no inverted nipple, no mass, no nipple discharge, no skin change and no tenderness. Left breast exhibits no inverted nipple, no mass, no nipple discharge, no skin change and no tenderness. Breasts are symmetrical.  Abdominal: Soft. Bowel sounds are normal. She exhibits no distension and no mass. There is no tenderness. There is no rebound and no guarding.  Musculoskeletal: Normal range of motion. She exhibits no edema or tenderness.  Lymphadenopathy:    She has no cervical adenopathy.  Neurological: She is alert and oriented to person, place, and time. No cranial nerve deficit. She exhibits normal muscle tone. Coordination normal.  Skin: Skin is warm and dry. No rash noted. She is not diaphoretic. No erythema. No pallor.  Psychiatric: Her speech is normal and behavior is normal. Judgment and thought content normal. Her mood appears anxious. She expresses no suicidal ideation.          Assessment & Plan:   Problem List Items Addressed This Visit      Unprioritized   Menopausal symptoms    Recent worsening menopausal symptoms. Will start Wellbutrin 150mg  daily. Discussed potential benefits and risks of this medication. Follow up in 4 weeks.      Relevant Medications   buPROPion (WELLBUTRIN XL) 150 MG 24 hr tablet   Obesity (BMI 30-39.9)    Wt Readings from  Last 3 Encounters:  07/07/15 224 lb 4 oz (101.719 kg)  09/11/13 175 lb (79.379 kg)  02/22/13 188 lb (85.276 kg)   Body mass index is 38.19 kg/(m^2). Encouraged healthy diet and exercise with goal of weight loss.      Routine general medical examination at a health care facility - Primary     General medical exam normal today including breast exam. PAP and pelvic deferred as normal in 2014, HPV neg. Mammogram ordered. Labs as ordered. Encouraged healthy diet and exercise. Immunizations are UTD.      Relevant Orders   TSH   CBC with Differential/Platelet   Comprehensive metabolic panel   Lipid panel   Microalbumin / creatinine urine ratio   Vit D  25 hydroxy (rtn osteoporosis monitoring)   Hemoglobin A1c       Return in about 4 weeks (around 08/04/2015) for Recheck.

## 2015-07-10 ENCOUNTER — Ambulatory Visit
Admission: RE | Admit: 2015-07-10 | Discharge: 2015-07-10 | Disposition: A | Discharge status: Home / Self Care | Payer: Managed Care, Other (non HMO) | Source: Ambulatory Visit | Attending: Internal Medicine | Admitting: Internal Medicine

## 2015-07-10 DIAGNOSIS — Z1231 Encounter for screening mammogram for malignant neoplasm of breast: Secondary | ICD-10-CM

## 2015-08-05 ENCOUNTER — Encounter: Payer: Self-pay | Admitting: Internal Medicine

## 2015-08-05 ENCOUNTER — Ambulatory Visit (INDEPENDENT_AMBULATORY_CARE_PROVIDER_SITE_OTHER): Payer: Managed Care, Other (non HMO) | Admitting: Internal Medicine

## 2015-08-05 VITALS — BP 132/81 | HR 64 | Temp 98.7°F | Ht 64.25 in | Wt 223.5 lb

## 2015-08-05 DIAGNOSIS — N951 Menopausal and female climacteric states: Secondary | ICD-10-CM | POA: Diagnosis not present

## 2015-08-05 DIAGNOSIS — E079 Disorder of thyroid, unspecified: Secondary | ICD-10-CM | POA: Diagnosis not present

## 2015-08-05 DIAGNOSIS — R079 Chest pain, unspecified: Secondary | ICD-10-CM | POA: Insufficient documentation

## 2015-08-05 LAB — TSH: TSH: 4.34 u[IU]/mL (ref 0.35–4.50)

## 2015-08-05 MED ORDER — BUPROPION HCL ER (XL) 150 MG PO TB24: 150.0000 mg | ORAL_TABLET | Freq: Every day | ORAL | Status: DC

## 2015-08-05 NOTE — Assessment & Plan Note (Signed)
Will recheck TSH with labs today. 

## 2015-08-05 NOTE — Assessment & Plan Note (Signed)
Symptoms improved with Wellbutrin. Discussed potentially increasing dose to 300mg  daily, however will wait for now. Follow up 6 months and prn.

## 2015-08-05 NOTE — Progress Notes (Signed)
Pre visit review using our clinic review tool, if applicable. No additional management support is needed unless otherwise documented below in the visit note. 

## 2015-08-05 NOTE — Patient Instructions (Addendum)
Labs today.  We will set up cardiology evaluation. Call if any worsening symptoms of chest pain or palpitations.  Follow up in 6 months or sooner as needed.

## 2015-08-05 NOTE — Assessment & Plan Note (Addendum)
Left chest pain, described as tightness, mild, fleeting. Exam normal. EKG shows PVCs and poor R-wave progression. Symptoms atypical for CAD, however given EKG findings, will set up evaluation with cardiology. She will call or return to clinic if any recurrent symptoms.

## 2015-08-05 NOTE — Progress Notes (Signed)
Subjective:    Patient ID: Kathleen Hull, female    DOB: 06-19-1966, 48 y.o.   MRN: [id number]  HPI  49YO female presents for follow up.  Last seen 7/25. Started on Wellbutrin to help with menopausal symptoms. Notes some improvement in anxiety on the medication. No adverse side effects noted.  Also noted to have mild elevation of TSH on labs. TSH 4.71. Notes some fatigue, difficulty losing weight. Several relatives with thyroid dysfunction.  Over last 5 days, having some intermittent left chest tightness. Described as mild. Comes and goes at rest. No dyspnea, palpitations, nausea, diaphoresis. Attributes symptoms to stress of starting back to school. Not taking anything for pain.   Wt Readings from Last 3 Encounters:  08/05/15 223 lb 8 oz (101.379 kg)  07/07/15 224 lb 4 oz (101.719 kg)  09/11/13 175 lb (79.379 kg)   BP Readings from Last 3 Encounters:  08/05/15 132/81  07/07/15 119/83  09/11/13 120/80     Past Medical History  Diagnosis Date  . Squamous cell skin cancer    Family History  Problem Relation Age of Onset  . Thyroid disease Mother   . Diabetes Father    Past Surgical History  Procedure Laterality Date  . Cholecystectomy      [place]  . Vaginal delivery      3  . Wisdom tooth extraction     Social History   Social History  . Marital Status: Married    Spouse Name: N/A  . Number of Children: N/A  . Years of Education: N/A   Social History Main Topics  . Smoking status: Never Smoker   . Smokeless tobacco: Never Used  . Alcohol Use: No  . Drug Use: No  . Sexual Activity: Not Asked   Other Topics Concern  . None   Social History Narrative   Lives in [place] with husband and 3 children.      Work - works at [employer], regular      Exercise - walking    Review of Systems  Constitutional: Negative for fever, chills, diaphoresis, appetite change, fatigue and unexpected weight change.  Eyes: Negative for visual  disturbance.  Respiratory: Positive for chest tightness. Negative for shortness of breath.   Cardiovascular: Positive for chest pain. Negative for leg swelling.  Gastrointestinal: Negative for nausea, abdominal pain, diarrhea and constipation.  Skin: Negative for color change and rash.  Hematological: Negative for adenopathy. Does not bruise/bleed easily.  Psychiatric/Behavioral: Negative for sleep disturbance and dysphoric mood. The patient is nervous/anxious.        Objective:    BP 132/81 mmHg  Pulse 64  Temp(Src) 98.7 F (37.1 C) (Oral)  Ht 5' 4.25" (1.632 m)  Wt 223 lb 8 oz (101.379 kg)  BMI 38.06 kg/m2  SpO2 97%  LMP 06/20/2015 Physical Exam  Constitutional: She is oriented to person, place, and time. She appears well-developed and well-nourished. No distress.  HENT:  Head: Normocephalic and atraumatic.  Right Ear: External ear normal.  Left Ear: External ear normal.  Nose: Nose normal.  Mouth/Throat: Oropharynx is clear and moist.  Eyes: Conjunctivae are normal. Pupils are equal, round, and reactive to light. Right eye exhibits no discharge. Left eye exhibits no discharge. No scleral icterus.  Neck: Normal range of motion. Neck supple. No tracheal deviation present. No thyromegaly present.  Cardiovascular: Normal rate, regular rhythm, normal heart sounds and intact distal pulses.  Exam reveals no gallop and  no friction rub.   No murmur heard. Pulmonary/Chest: Effort normal and breath sounds normal. No respiratory distress. She has no wheezes. She has no rales. She exhibits no tenderness.  Musculoskeletal: Normal range of motion. She exhibits no edema or tenderness.  Lymphadenopathy:    She has no cervical adenopathy.  Neurological: She is alert and oriented to person, place, and time. No cranial nerve deficit. She exhibits normal muscle tone. Coordination normal.  Skin: Skin is warm and dry. No rash noted. She is not diaphoretic. No erythema. No pallor.  Psychiatric: She  has a normal mood and affect. Her behavior is normal. Judgment and thought content normal.          Assessment & Plan:   Problem List Items Addressed This Visit      Unprioritized   Menopausal symptoms - Primary    Symptoms improved with Wellbutrin. Discussed potentially increasing dose to 300mg  daily, however will wait for now. Follow up 6 months and prn.      Relevant Medications   buPROPion (WELLBUTRIN XL) 150 MG 24 hr tablet   Pain in the chest    Left chest pain, described as tightness, mild, fleeting. Exam normal. EKG shows PVCs and poor R-wave progression. Symptoms atypical for CAD, however given EKG findings, will set up evaluation with cardiology. She will call or return to clinic if any recurrent symptoms.      Relevant Orders   EKG 12-Lead (Completed)   Ambulatory referral to Cardiology   Thyroid disorder    Will recheck TSH with labs today.      Relevant Orders   TSH       Return in about 4 weeks (around 09/02/2015) for Recheck.

## 2015-08-11 ENCOUNTER — Encounter: Payer: Self-pay | Admitting: Internal Medicine

## 2015-08-28 ENCOUNTER — Encounter: Payer: Self-pay | Admitting: *Deleted

## 2015-09-01 ENCOUNTER — Encounter: Payer: Self-pay | Admitting: Internal Medicine

## 2015-09-08 ENCOUNTER — Ambulatory Visit: Payer: Managed Care, Other (non HMO) | Admitting: Internal Medicine

## 2016-05-21 DIAGNOSIS — I10 Essential (primary) hypertension: Secondary | ICD-10-CM | POA: Insufficient documentation

## 2017-05-12 ENCOUNTER — Other Ambulatory Visit: Payer: Self-pay | Admitting: Internal Medicine

## 2017-06-13 ENCOUNTER — Encounter: Payer: Self-pay | Admitting: Family

## 2017-06-13 ENCOUNTER — Other Ambulatory Visit (HOSPITAL_COMMUNITY)
Admission: RE | Admit: 2017-06-13 | Discharge: 2017-06-13 | Disposition: A | Discharge status: Home / Self Care | Payer: 59 | Source: Ambulatory Visit | Attending: Family | Admitting: Family

## 2017-06-13 ENCOUNTER — Ambulatory Visit (INDEPENDENT_AMBULATORY_CARE_PROVIDER_SITE_OTHER): Payer: 59 | Admitting: Family

## 2017-06-13 VITALS — BP 136/98 | HR 75 | Temp 98.1°F | Ht 64.25 in | Wt 234.0 lb

## 2017-06-13 DIAGNOSIS — Z0000 Encounter for general adult medical examination without abnormal findings: Secondary | ICD-10-CM

## 2017-06-13 DIAGNOSIS — N951 Menopausal and female climacteric states: Secondary | ICD-10-CM | POA: Diagnosis not present

## 2017-06-13 DIAGNOSIS — Z0001 Encounter for general adult medical examination with abnormal findings: Secondary | ICD-10-CM

## 2017-06-13 MED ORDER — BUPROPION HCL ER (XL) 150 MG PO TB24: 150.0000 mg | ORAL_TABLET | Freq: Every day | ORAL | 3 refills | Status: DC

## 2017-06-13 NOTE — Assessment & Plan Note (Signed)
Uncontrolled. We'll go back on Wellbutrin. Advised patient she may increase to 150 mg tablet to 300 mg tablet if needed.

## 2017-06-13 NOTE — Progress Notes (Signed)
Pre visit review using our clinic review tool, if applicable. No additional management support is needed unless otherwise documented below in the visit note. 

## 2017-06-13 NOTE — Assessment & Plan Note (Signed)
Pap and clinical breast exam performed today. Encouraged daily exercise. Screening labs ordered. Patient will schedule mammogram and I advised 3-D.

## 2017-06-13 NOTE — Progress Notes (Signed)
Subjective:    Patient ID: Kathleen Hull, female    DOB: 03-12-1966, 51 y.o.   MRN: [id number]  CC: Kathleen Hull is a 51 y.o. female who presents today for physical exam.    HPI:   Anxiety- no anxiety attacks. No depression. Related to kids. Feels irritable. Would like to go back to wellbutrin. No h/o seizure.     Colorectal Cancer Screening: no early family history Breast Cancer Screening: Mammogram due Cervical Cancer Screening: due Bone Health screening/DEXA for 65+: No increased fracture risk. Defer screening at this time. Lung Cancer Screening: Doesn't have 30 year pack year history and age > 5 years. Immunizations       Tetanus - utd        HIV Screening- Candidate for , declines Labs: Screening labs today. Exercise: Gets regular exercise.  Alcohol use: None Smoking/tobacco use: Nonsmoker.  Regular dental exams: UTD Wears seat belt: Yes. Skin: follows with dermatology.   HISTORY:  Past Medical History:  Diagnosis Date  . Squamous cell skin cancer 2013    Past Surgical History:  Procedure Laterality Date  . CHOLECYSTECTOMY     [place]  . VAGINAL DELIVERY     3  . WISDOM TOOTH EXTRACTION     Family History  Problem Relation Age of Onset  . Thyroid disease Mother   . Diabetes Father       ALLERGIES: Patient has no known allergies.  No current outpatient prescriptions on file prior to visit.   No current facility-administered medications on file prior to visit.     Social History  Substance Use Topics  . Smoking status: Never Smoker  . Smokeless tobacco: Never Used  . Alcohol use No    Review of Systems  Constitutional: Negative for chills, fever and unexpected weight change.  HENT: Negative for congestion.   Respiratory: Negative for cough.   Cardiovascular: Negative for chest pain, palpitations and leg swelling.  Gastrointestinal: Negative for nausea and vomiting.  Musculoskeletal: Negative for arthralgias and myalgias.  Skin:  Negative for rash.  Neurological: Negative for headaches.  Hematological: Negative for adenopathy.  Psychiatric/Behavioral: Negative for confusion. The patient is nervous/anxious.       Objective:    BP (!) 136/98   Pulse 75   Temp 98.1 F (36.7 C) (Oral)   Ht 5' 4.25" (1.632 m)   Wt 234 lb (106.1 kg)   SpO2 97%   BMI 39.85 kg/m   BP Readings from Last 3 Encounters:  06/13/17 (!) 136/98  08/05/15 132/81  07/07/15 119/83   Wt Readings from Last 3 Encounters:  06/13/17 234 lb (106.1 kg)  08/05/15 223 lb 8 oz (101.4 kg)  07/07/15 224 lb 4 oz (101.7 kg)    Physical Exam  Constitutional: She appears well-developed and well-nourished.  Eyes: Conjunctivae are normal.  Neck: No thyroid mass and no thyromegaly present.  Cardiovascular: Normal rate, regular rhythm, normal heart sounds and normal pulses.   Pulmonary/Chest: Effort normal and breath sounds normal. She has no wheezes. She has no rhonchi. She has no rales. Right breast exhibits no inverted nipple, no mass, no nipple discharge, no skin change and no tenderness. Left breast exhibits no inverted nipple, no mass, no nipple discharge, no skin change and no tenderness. Breasts are symmetrical.  No masses or asymmetry appreciated during CBE.  Genitourinary: Uterus is not enlarged, not fixed and not tender. Cervix exhibits no motion tenderness, no discharge and no friability. Right adnexum displays no mass, no  tenderness and no fullness. Left adnexum displays no mass, no tenderness and no fullness.  Genitourinary Comments: Pap performed. No CMT. Unable to appreciated ovaries.  Lymphadenopathy:       Head (right side): No submental, no submandibular, no tonsillar, no preauricular, no posterior auricular and no occipital adenopathy present.       Head (left side): No submental, no submandibular, no tonsillar, no preauricular, no posterior auricular and no occipital adenopathy present.       Right cervical: No superficial cervical, no  deep cervical and no posterior cervical adenopathy present.      Left cervical: No superficial cervical, no deep cervical and no posterior cervical adenopathy present.    She has no axillary adenopathy.       Right axillary: No pectoral and no lateral adenopathy present.       Left axillary: No pectoral and no lateral adenopathy present. Neurological: She is alert.  Skin: Skin is warm and dry.  Psychiatric: She has a normal mood and affect. Her speech is normal and behavior is normal. Thought content normal.  Vitals reviewed.      Assessment & Plan:   Problem List Items Addressed This Visit      Other   Routine general medical examination at a health care facility - Primary    Pap and clinical breast exam performed today. Encouraged daily exercise. Screening labs ordered. Patient will schedule mammogram and I advised 3-D.      Relevant Orders   CBC with Differential/Platelet   Comprehensive metabolic panel   Hemoglobin A1c   Lipid panel   TSH   VITAMIN D 25 Hydroxy (Vit-D Deficiency, Fractures)   MM SCREENING BREAST TOMO BILATERAL   Cytology - PAP   Menopausal symptoms    Uncontrolled. We'll go back on Wellbutrin. Advised patient she may increase to 150 mg tablet to 300 mg tablet if needed.      Relevant Medications   buPROPion (WELLBUTRIN XL) 150 MG 24 hr tablet       I am having Kathleen Hull maintain her buPROPion.   Meds ordered this encounter  Medications  . buPROPion (WELLBUTRIN XL) 150 MG 24 hr tablet    Sig: Take 1 tablet (150 mg total) by mouth daily.    Dispense:  90 tablet    Refill:  3    Order Specific Question:   Supervising Provider    Answer:   Crecencio Mc [[id number]]    Return precautions given.   Risks, benefits, and alternatives of the medications and treatment plan prescribed today were discussed, and patient expressed understanding.   Education regarding symptom management and diagnosis given to patient on AVS.   Continue to follow with  Burnard Hawthorne, FNP for routine health maintenance.   Jannette Fogo and I agreed with plan.   Mable Paris, FNP

## 2017-06-13 NOTE — Patient Instructions (Addendum)
Pleasure meeting you  May increase after ONE week the wellbutrin 378m in the morning- that would be 2 of the 1526mtablets   Labs  Health Maintenance, Female Adopting a healthy lifestyle and getting preventive care can go a long way to promote health and wellness. Talk with your health care provider about what schedule of regular examinations is right for you. This is a good chance for you to check in with your provider about disease prevention and staying healthy. In between checkups, there are plenty of things you can do on your own. Experts have done a lot of research about which lifestyle changes and preventive measures are most likely to keep you healthy. Ask your health care provider for more information. Weight and diet Eat a healthy diet  Be sure to include plenty of vegetables, fruits, low-fat dairy products, and lean protein.  Do not eat a lot of foods high in solid fats, added sugars, or salt.  Get regular exercise. This is one of the most important things you can do for your health. ? Most adults should exercise for at least 150 minutes each week. The exercise should increase your heart rate and make you sweat (moderate-intensity exercise). ? Most adults should also do strengthening exercises at least twice a week. This is in addition to the moderate-intensity exercise.  Maintain a healthy weight  Body mass index (BMI) is a measurement that can be used to identify possible weight problems. It estimates body fat based on height and weight. Your health care provider can help determine your BMI and help you achieve or maintain a healthy weight.  For females 2084ears of age and older: ? A BMI below 18.5 is considered underweight. ? A BMI of 18.5 to 24.9 is normal. ? A BMI of 25 to 29.9 is considered overweight. ? A BMI of 30 and above is considered obese.  Watch levels of cholesterol and blood lipids  You should start having your blood tested for lipids and cholesterol at 49years of age, then have this test every 5 years.  You may need to have your cholesterol levels checked more often if: ? Your lipid or cholesterol levels are high. ? You are older than 4982ears of age. ? You are at high risk for heart disease.  Cancer screening Lung Cancer  Lung cancer screening is recommended for adults [id number] old who are at high risk for lung cancer because of a history of smoking.  A yearly low-dose CT scan of the lungs is recommended for people who: ? Currently smoke. ? Have quit within the past 15 years. ? Have at least a 30-pack-year history of smoking. A pack year is smoking an average of one pack of cigarettes a day for 1 year.  Yearly screening should continue until it has been 15 years since you quit.  Yearly screening should stop if you develop a health problem that would prevent you from having lung cancer treatment.  Breast Cancer  Practice breast self-awareness. This means understanding how your breasts normally appear and feel.  It also means doing regular breast self-exams. Let your health care provider know about any changes, no matter how small.  If you are in your 20s or 30s, you should have a clinical breast exam (CBE) by a health care provider every 1-3 years as part of a regular health exam.  If you are 49r older, have a CBE every year. Also consider having a breast X-ray (mammogram) every  year.  If you have a family history of breast cancer, talk to your health care provider about genetic screening.  If you are at high risk for breast cancer, talk to your health care provider about having an MRI and a mammogram every year.  Breast cancer gene (BRCA) assessment is recommended for women who have family members with BRCA-related cancers. BRCA-related cancers include: ? Breast. ? Ovarian. ? Tubal. ? Peritoneal cancers.  Results of the assessment will determine the need for genetic counseling and BRCA1 and BRCA2 testing.  Cervical  Cancer Your health care provider may recommend that you be screened regularly for cancer of the pelvic organs (ovaries, uterus, and vagina). This screening involves a pelvic examination, including checking for microscopic changes to the surface of your cervix (Pap test). You may be encouraged to have this screening done every 3 years, beginning at age 49.  For women ages 90-65, health care providers may recommend pelvic exams and Pap testing every 3 years, or they may recommend the Pap and pelvic exam, combined with testing for human papilloma virus (HPV), every 5 years. Some types of HPV increase your risk of cervical cancer. Testing for HPV may also be done on women of any age with unclear Pap test results.  Other health care providers may not recommend any screening for nonpregnant women who are considered low risk for pelvic cancer and who do not have symptoms. Ask your health care provider if a screening pelvic exam is right for you.  If you have had past treatment for cervical cancer or a condition that could lead to cancer, you need Pap tests and screening for cancer for at least 20 years after your treatment. If Pap tests have been discontinued, your risk factors (such as having a new sexual partner) need to be reassessed to determine if screening should resume. Some women have medical problems that increase the chance of getting cervical cancer. In these cases, your health care provider may recommend more frequent screening and Pap tests.  Colorectal Cancer  This type of cancer can be detected and often prevented.  Routine colorectal cancer screening usually begins at 49 years of age and continues through 49 years of age.  Your health care provider may recommend screening at an earlier age if you have risk factors for colon cancer.  Your health care provider may also recommend using home test kits to check for hidden blood in the stool.  A small camera at the end of a tube can be used to  examine your colon directly (sigmoidoscopy or colonoscopy). This is done to check for the earliest forms of colorectal cancer.  Routine screening usually begins at age 49.  Direct examination of the colon should be repeated every 5-10 years through 49 years of age. However, you may need to be screened more often if early forms of precancerous polyps or small growths are found.  Skin Cancer  Check your skin from head to toe regularly.  Tell your health care provider about any new moles or changes in moles, especially if there is a change in a mole's shape or color.  Also tell your health care provider if you have a mole that is larger than the size of a pencil eraser.  Always use sunscreen. Apply sunscreen liberally and repeatedly throughout the day.  Protect yourself by wearing long sleeves, pants, a wide-brimmed hat, and sunglasses whenever you are outside.  Heart disease, diabetes, and high blood pressure  High blood pressure causes heart  disease and increases the risk of stroke. High blood pressure is more likely to develop in: ? People who have blood pressure in the high end of the normal range (130-139/85-89 mm Hg). ? People who are overweight or obese. ? People who are African American.  If you are 68-78 years of age, have your blood pressure checked every 3-5 years. If you are 61 years of age or older, have your blood pressure checked every year. You should have your blood pressure measured twice-once when you are at a hospital or clinic, and once when you are not at a hospital or clinic. Record the average of the two measurements. To check your blood pressure when you are not at a hospital or clinic, you can use: ? An automated blood pressure machine at a pharmacy. ? A home blood pressure monitor.  If you are between 45 years and 15 years old, ask your health care provider if you should take aspirin to prevent strokes.  Have regular diabetes screenings. This involves taking a  blood sample to check your fasting blood sugar level. ? If you are at a normal weight and have a low risk for diabetes, have this test once every three years after 49 years of age. ? If you are overweight and have a high risk for diabetes, consider being tested at a younger age or more often. Preventing infection Hepatitis B  If you have a higher risk for hepatitis B, you should be screened for this virus. You are considered at high risk for hepatitis B if: ? You were born in a country where hepatitis B is common. Ask your health care provider which countries are considered high risk. ? Your parents were born in a high-risk country, and you have not been immunized against hepatitis B (hepatitis B vaccine). ? You have HIV or AIDS. ? You use needles to inject street drugs. ? You live with someone who has hepatitis B. ? You have had sex with someone who has hepatitis B. ? You get hemodialysis treatment. ? You take certain medicines for conditions, including cancer, organ transplantation, and autoimmune conditions.  Hepatitis C  Blood testing is recommended for: ? Everyone born from [birth year] through 1963. ? Anyone with known risk factors for hepatitis C.  Sexually transmitted infections (STIs)  You should be screened for sexually transmitted infections (STIs) including gonorrhea and chlamydia if: ? You are sexually active and are younger than 49 years of age. ? You are older than 49 years of age and your health care provider tells you that you are at risk for this type of infection. ? Your sexual activity has changed since you were last screened and you are at an increased risk for chlamydia or gonorrhea. Ask your health care provider if you are at risk.  If you do not have HIV, but are at risk, it may be recommended that you take a prescription medicine daily to prevent HIV infection. This is called pre-exposure prophylaxis (PrEP). You are considered at risk if: ? You are sexually active and  do not regularly use condoms or know the HIV status of your partner(s). ? You take drugs by injection. ? You are sexually active with a partner who has HIV.  Talk with your health care provider about whether you are at high risk of being infected with HIV. If you choose to begin PrEP, you should first be tested for HIV. You should then be tested every 3 months for as long as you  are taking PrEP. Pregnancy  If you are premenopausal and you may become pregnant, ask your health care provider about preconception counseling.  If you may become pregnant, take 400 to 800 micrograms (mcg) of folic acid every day.  If you want to prevent pregnancy, talk to your health care provider about birth control (contraception). Osteoporosis and menopause  Osteoporosis is a disease in which the bones lose minerals and strength with aging. This can result in serious bone fractures. Your risk for osteoporosis can be identified using a bone density scan.  If you are 58 years of age or older, or if you are at risk for osteoporosis and fractures, ask your health care provider if you should be screened.  Ask your health care provider whether you should take a calcium or vitamin D supplement to lower your risk for osteoporosis.  Menopause may have certain physical symptoms and risks.  Hormone replacement therapy may reduce some of these symptoms and risks. Talk to your health care provider about whether hormone replacement therapy is right for you. Follow these instructions at home:  Schedule regular health, dental, and eye exams.  Stay current with your immunizations.  Do not use any tobacco products including cigarettes, chewing tobacco, or electronic cigarettes.  If you are pregnant, do not drink alcohol.  If you are breastfeeding, limit how much and how often you drink alcohol.  Limit alcohol intake to no more than 1 drink per day for nonpregnant women. One drink equals 12 ounces of beer, 5 ounces of  wine, or 1 ounces of hard liquor.  Do not use street drugs.  Do not share needles.  Ask your health care provider for help if you need support or information about quitting drugs.  Tell your health care provider if you often feel depressed.  Tell your health care provider if you have ever been abused or do not feel safe at home. This information is not intended to replace advice given to you by your health care provider. Make sure you discuss any questions you have with your health care provider. Document Released: 06/14/2011 Document Revised: 05/06/2016 Document Reviewed: 09/02/2015 Elsevier Interactive Patient Education  [employer].

## 2017-06-14 ENCOUNTER — Other Ambulatory Visit (INDEPENDENT_AMBULATORY_CARE_PROVIDER_SITE_OTHER): Payer: 59

## 2017-06-14 DIAGNOSIS — Z0000 Encounter for general adult medical examination without abnormal findings: Secondary | ICD-10-CM

## 2017-06-14 LAB — HEMOGLOBIN A1C: Hgb A1c MFr Bld: 4.8 % (ref 4.6–6.5)

## 2017-06-14 LAB — CBC WITH DIFFERENTIAL/PLATELET
BASOS ABS: 0.1 10*3/uL (ref 0.0–0.1)
BASOS PCT: 0.7 % (ref 0.0–3.0)
Eosinophils Absolute: 0.2 10*3/uL (ref 0.0–0.7)
Eosinophils Relative: 2.2 % (ref 0.0–5.0)
HCT: 38.1 % (ref 36.0–46.0)
Hemoglobin: 12.9 g/dL (ref 12.0–15.0)
LYMPHS ABS: 2.4 10*3/uL (ref 0.7–4.0)
LYMPHS PCT: 30.9 % (ref 12.0–46.0)
MCHC: 33.9 g/dL (ref 30.0–36.0)
MCV: 97.2 fl (ref 78.0–100.0)
MONOS PCT: 4.4 % (ref 3.0–12.0)
Monocytes Absolute: 0.3 10*3/uL (ref 0.1–1.0)
NEUTROS ABS: 4.8 10*3/uL (ref 1.4–7.7)
Neutrophils Relative %: 61.8 % (ref 43.0–77.0)
PLATELETS: 294 10*3/uL (ref 150.0–400.0)
RBC: 3.92 Mil/uL (ref 3.87–5.11)
RDW: 13.2 % (ref 11.5–15.5)
WBC: 7.8 10*3/uL (ref 4.0–10.5)

## 2017-06-14 LAB — COMPREHENSIVE METABOLIC PANEL
ALT: 34 U/L (ref 0–35)
AST: 26 U/L (ref 0–37)
Albumin: 4.1 g/dL (ref 3.5–5.2)
Alkaline Phosphatase: 78 U/L (ref 39–117)
BILIRUBIN TOTAL: 0.7 mg/dL (ref 0.2–1.2)
BUN: 14 mg/dL (ref 6–23)
CHLORIDE: 105 meq/L (ref 96–112)
CO2: 27 meq/L (ref 19–32)
Calcium: 9.1 mg/dL (ref 8.4–10.5)
Creatinine, Ser: 0.9 mg/dL (ref 0.40–1.20)
GFR: 70.86 mL/min (ref >60.00)
Glucose, Bld: 98 mg/dL (ref 70–99)
POTASSIUM: 4 meq/L (ref 3.5–5.1)
Sodium: 138 mEq/L (ref 135–145)
Total Protein: 7.3 g/dL (ref 6.0–8.3)

## 2017-06-14 LAB — VITAMIN D 25 HYDROXY (VIT D DEFICIENCY, FRACTURES): VITD: 27.57 ng/mL — AB (ref 30.00–100.00)

## 2017-06-14 LAB — LIPID PANEL
CHOL/HDL RATIO: 4
CHOLESTEROL: 140 mg/dL (ref 0–200)
HDL: 39.2 mg/dL (ref >39.00)
LDL CALC: 78 mg/dL (ref 0–99)
NonHDL: 101.1
TRIGLYCERIDES: 118 mg/dL (ref 0.0–149.0)
VLDL: 23.6 mg/dL (ref 0.0–40.0)

## 2017-06-14 LAB — TSH: TSH: 3.93 u[IU]/mL (ref 0.35–4.50)

## 2017-06-16 LAB — CYTOLOGY - PAP
Diagnosis: NEGATIVE
HPV: NOT DETECTED

## 2017-06-21 ENCOUNTER — Ambulatory Visit (INDEPENDENT_AMBULATORY_CARE_PROVIDER_SITE_OTHER): Payer: 59 | Admitting: Podiatry

## 2017-06-21 ENCOUNTER — Ambulatory Visit (INDEPENDENT_AMBULATORY_CARE_PROVIDER_SITE_OTHER): Payer: 59

## 2017-06-21 VITALS — BP 164/109 | HR 85 | Temp 98.1°F | Resp 16

## 2017-06-21 DIAGNOSIS — M79672 Pain in left foot: Secondary | ICD-10-CM

## 2017-06-21 DIAGNOSIS — M722 Plantar fascial fibromatosis: Secondary | ICD-10-CM

## 2017-06-21 DIAGNOSIS — M79671 Pain in right foot: Secondary | ICD-10-CM

## 2017-06-21 MED ORDER — METHYLPREDNISOLONE 4 MG PO TBPK: ORAL_TABLET | ORAL | 0 refills | Status: DC

## 2017-06-21 MED ORDER — DICLOFENAC SODIUM 75 MG PO TBEC: 75.0000 mg | DELAYED_RELEASE_TABLET | Freq: Two times a day (BID) | ORAL | 0 refills | Status: DC

## 2017-06-21 NOTE — Progress Notes (Signed)
Subjective:     Patient ID: Kathleen Hull, female   DOB: Jul 20, 1966, 51 y.o.   MRN: [id number]  HPI   Review of Systems  All other systems reviewed and are negative.      Objective:   Physical Exam     Assessment:         Plan:

## 2017-07-05 ENCOUNTER — Other Ambulatory Visit: Payer: Self-pay | Admitting: Family

## 2017-07-05 DIAGNOSIS — Z1231 Encounter for screening mammogram for malignant neoplasm of breast: Secondary | ICD-10-CM

## 2017-07-09 MED ORDER — BETAMETHASONE SOD PHOS & ACET 6 (3-3) MG/ML IJ SUSP: 3.0000 mg | Freq: Once | INTRAMUSCULAR | Status: DC

## 2017-07-09 NOTE — Progress Notes (Signed)
Patient ID: Kathleen Hull, female   DOB: 08-07-1966, 51 y.o.   MRN: [id number]   Subjective: Patient presents today for pain and tenderness in the feet bilaterally. Patient states the foot pain has been hurting for several weeks now. Patient states that it hurts in the mornings with the first steps out of bed. Patient presents today for further treatment and evaluation  Objective: Physical Exam General: The patient is alert and oriented x3 in no acute distress.  Dermatology: Skin is warm, dry and supple bilateral lower extremities. Negative for open lesions or macerations bilateral.   Vascular: Dorsalis Pedis and Posterior Tibial pulses palpable bilateral.  Capillary fill time is immediate to all digits.  Neurological: Epicritic and protective threshold intact bilateral.   Musculoskeletal: Tenderness to palpation at the medial calcaneal tubercale and through the insertion of the plantar fascia of the bilateral feet. All other joints range of motion within normal limits bilateral. Strength 5/5 in all groups bilateral.   Radiographic exam: Normal osseous mineralization. Joint spaces preserved. No fracture/dislocation/boney destruction. Calcaneal spur present with mild thickening of plantar fascia bilateral. No other soft tissue abnormalities or radiopaque foreign bodies.   Assessment: 1. plantar fasciitis bilateral feet  Plan of Care:  1. Patient evaluated. Xrays reviewed.   2. Injection of 0.5cc Celestone soluspan injected into the bilateral heels.  3. Rx for Medrol Dose Pak placed 4. Rx for Diclofenac 75mg  PO BID ordered for patient. 4. Plantar fascial band(s) dispensed for bilateral plantar fasciitis. 5. Instructed patient regarding therapies and modalities at home to alleviate symptoms.  6. Return to clinic in 4 weeks.    Edrick Kins, DPM [facility]  Dr. Edrick Kins, DPM    [address],  [address]                 Office [phone]  Fax [phone]

## 2017-07-13 ENCOUNTER — Ambulatory Visit
Admission: RE | Admit: 2017-07-13 | Discharge: 2017-07-13 | Disposition: A | Discharge status: Home / Self Care | Payer: 59 | Source: Ambulatory Visit | Attending: Family | Admitting: Family

## 2017-07-13 DIAGNOSIS — Z1231 Encounter for screening mammogram for malignant neoplasm of breast: Secondary | ICD-10-CM

## 2017-07-19 ENCOUNTER — Ambulatory Visit (INDEPENDENT_AMBULATORY_CARE_PROVIDER_SITE_OTHER): Payer: 59 | Admitting: Podiatry

## 2017-07-19 DIAGNOSIS — M722 Plantar fascial fibromatosis: Secondary | ICD-10-CM | POA: Diagnosis not present

## 2017-07-19 MED ORDER — IBUPROFEN-FAMOTIDINE 800-26.6 MG PO TABS: ORAL_TABLET | ORAL | 1 refills | Status: DC

## 2017-07-23 NOTE — Progress Notes (Signed)
Patient ID: Kathleen Hull, female   DOB: 05/09/1966, 51 y.o.   MRN: [id number]   Subjective: Patient presents today for follow-up treatment and evaluation regarding plantar fasciitis to the bilateral feet. Patient states that her left foot is doing great. Her right foot continues to have some slight pain. Patient is much better overall. She believes the plantar fascial braces help. Also the injections helped. She does not believe the diclofenac helps.  Objective: Physical Exam General: The patient is alert and oriented x3 in no acute distress.  Dermatology: Skin is warm, dry and supple bilateral lower extremities. Negative for open lesions or macerations bilateral.   Vascular: Dorsalis Pedis and Posterior Tibial pulses palpable bilateral.  Capillary fill time is immediate to all digits.  Neurological: Epicritic and protective threshold intact bilateral.   Musculoskeletal: Tenderness to palpation at the medial calcaneal tubercale and through the insertion of the plantar fascia of the bilateral feet. All other joints range of motion within normal limits bilateral. Strength 5/5 in all groups bilateral.    Assessment: 1. plantar fasciitis bilateral feet  Plan of Care:  1. Patient evaluated.  2. Prescription for Duexis at [facility] 3. Continue plantar fascial braces when necessary 4. The patient does not want a plantar fascial injection today. 5. Return to clinic when necessary. Patient is significantly improved.    Edrick Kins, DPM [facility]  Dr. Edrick Kins, DPM    [address], [place] [address]                Office [phone]  Fax [phone]

## 2018-06-22 ENCOUNTER — Other Ambulatory Visit: Payer: Self-pay | Admitting: Family

## 2018-06-22 DIAGNOSIS — N951 Menopausal and female climacteric states: Secondary | ICD-10-CM

## 2018-07-14 ENCOUNTER — Ambulatory Visit (INDEPENDENT_AMBULATORY_CARE_PROVIDER_SITE_OTHER): Payer: 59 | Admitting: Family

## 2018-07-14 ENCOUNTER — Encounter: Payer: Self-pay | Admitting: Family

## 2018-07-14 VITALS — BP 124/90 | HR 70 | Temp 98.6°F | Resp 15 | Ht 64.5 in | Wt 231.5 lb

## 2018-07-14 DIAGNOSIS — Z0000 Encounter for general adult medical examination without abnormal findings: Secondary | ICD-10-CM

## 2018-07-14 DIAGNOSIS — E669 Obesity, unspecified: Secondary | ICD-10-CM | POA: Diagnosis not present

## 2018-07-14 DIAGNOSIS — R21 Rash and other nonspecific skin eruption: Secondary | ICD-10-CM

## 2018-07-14 DIAGNOSIS — E559 Vitamin D deficiency, unspecified: Secondary | ICD-10-CM | POA: Diagnosis not present

## 2018-07-14 LAB — COMPREHENSIVE METABOLIC PANEL
ALBUMIN: 4.4 g/dL (ref 3.5–5.2)
ALK PHOS: 86 U/L (ref 39–117)
ALT: 33 U/L (ref 0–35)
AST: 28 U/L (ref 0–37)
BUN: 12 mg/dL (ref 6–23)
CALCIUM: 9.4 mg/dL (ref 8.4–10.5)
CO2: 28 meq/L (ref 19–32)
Chloride: 104 mEq/L (ref 96–112)
Creatinine, Ser: 1.1 mg/dL (ref 0.40–1.20)
GFR: 55.96 mL/min — AB (ref >60.00)
GLUCOSE: 102 mg/dL — AB (ref 70–99)
Potassium: 4.2 mEq/L (ref 3.5–5.1)
Sodium: 140 mEq/L (ref 135–145)
Total Bilirubin: 0.7 mg/dL (ref 0.2–1.2)
Total Protein: 7.5 g/dL (ref 6.0–8.3)

## 2018-07-14 LAB — TSH: TSH: 3.64 u[IU]/mL (ref 0.35–4.50)

## 2018-07-14 LAB — VITAMIN D 25 HYDROXY (VIT D DEFICIENCY, FRACTURES): VITD: 27.28 ng/mL — ABNORMAL LOW (ref 30.00–100.00)

## 2018-07-14 NOTE — Assessment & Plan Note (Signed)
Pending TSH.  Referral to Redgie Grayer.  Will follow

## 2018-07-14 NOTE — Patient Instructions (Addendum)
Today we discussed referrals, orders. Kathleen Hull , medical weight loss physician, colononscopy.     I have placed these orders in the system for you.  Please be sure to give [place] a call if you have not heard from our office regarding scheduling a test or regarding referral in a timely manner.  It is very important that you let me know as soon as possible.   We placed a referral for mammogram this year. I asked that you call one the below locations and schedule this when it is convenient for you.   As discussed, I would like you to ask for 3D mammogram over the traditional 2D mammogram as new evidence suggest 3D is superior.   Please note that NOT all insurance companies cover 3D and you may have to pay a higher copay. You may call your insurance company to further clarify your benefits.   Options for [place]  [facility], [place]  * [facility] [place], [place] * Note if you ask for 3D mammogram at this location, you must request [place], [place] location*   Health Maintenance, Female Adopting a healthy lifestyle and getting preventive care can go a long way to promote health and wellness. Talk with your health care provider about what schedule of regular examinations is right for you. This is a good chance for you to check in with your provider about disease prevention and staying healthy. In between checkups, there are plenty of things you can do on your own. Experts have done a lot of research about which lifestyle changes and preventive measures are most likely to keep you healthy. Ask your health care provider for more information. Weight and diet Eat a healthy diet  Be sure to include plenty of vegetables, fruits, low-fat dairy products, and lean protein.  Do not eat a lot of foods high in solid fats, added sugars, or salt.  Get  regular exercise. This is one of the most important things you can do for your health. ? Most adults should exercise for at least 150 minutes each week. The exercise should increase your heart rate and make you sweat (moderate-intensity exercise). ? Most adults should also do strengthening exercises at least twice a week. This is in addition to the moderate-intensity exercise.  Maintain a healthy weight  Body mass index (BMI) is a measurement that can be used to identify possible weight problems. It estimates body fat based on height and weight. Your health care provider can help determine your BMI and help you achieve or maintain a healthy weight.  For females 6 years of age and older: ? A BMI below 18.5 is considered underweight. ? A BMI of 18.5 to 24.9 is normal. ? A BMI of 25 to 29.9 is considered overweight. ? A BMI of 30 and above is considered obese.  Watch levels of cholesterol and blood lipids  You should start having your blood tested for lipids and cholesterol at 50 years of age, then have this test every 5 years.  You may need to have your cholesterol levels checked more often if: ? Your lipid or cholesterol levels are high. ? You are older than 50 years of age. ? You are at high risk for heart disease.  Cancer screening Lung Cancer  Lung cancer screening is recommended for adults 1-63 years old who are  at high risk for lung cancer because of a history of smoking.  A yearly low-dose CT scan of the lungs is recommended for people who: ? Currently smoke. ? Have quit within the past 15 years. ? Have at least a 30-pack-year history of smoking. A pack year is smoking an average of one pack of cigarettes a day for 1 year.  Yearly screening should continue until it has been 15 years since you quit.  Yearly screening should stop if you develop a health problem that would prevent you from having lung cancer treatment.  Breast Cancer  Practice breast self-awareness. This  means understanding how your breasts normally appear and feel.  It also means doing regular breast self-exams. Let your health care provider know about any changes, no matter how small.  If you are in your 20s or 30s, you should have a clinical breast exam (CBE) by a health care provider every 1-3 years as part of a regular health exam.  If you are 69 or older, have a CBE every year. Also consider having a breast X-ray (mammogram) every year.  If you have a family history of breast cancer, talk to your health care provider about genetic screening.  If you are at high risk for breast cancer, talk to your health care provider about having an MRI and a mammogram every year.  Breast cancer gene (BRCA) assessment is recommended for women who have family members with BRCA-related cancers. BRCA-related cancers include: ? Breast. ? Ovarian. ? Tubal. ? Peritoneal cancers.  Results of the assessment will determine the need for genetic counseling and BRCA1 and BRCA2 testing.  Cervical Cancer Your health care provider may recommend that you be screened regularly for cancer of the pelvic organs (ovaries, uterus, and vagina). This screening involves a pelvic examination, including checking for microscopic changes to the surface of your cervix (Pap test). You may be encouraged to have this screening done every 3 years, beginning at age 53.  For women ages 51-65, health care providers may recommend pelvic exams and Pap testing every 3 years, or they may recommend the Pap and pelvic exam, combined with testing for human papilloma virus (HPV), every 5 years. Some types of HPV increase your risk of cervical cancer. Testing for HPV may also be done on women of any age with unclear Pap test results.  Other health care providers may not recommend any screening for nonpregnant women who are considered low risk for pelvic cancer and who do not have symptoms. Ask your health care provider if a screening pelvic exam  is right for you.  If you have had past treatment for cervical cancer or a condition that could lead to cancer, you need Pap tests and screening for cancer for at least 20 years after your treatment. If Pap tests have been discontinued, your risk factors (such as having a new sexual partner) need to be reassessed to determine if screening should resume. Some women have medical problems that increase the chance of getting cervical cancer. In these cases, your health care provider may recommend more frequent screening and Pap tests.  Colorectal Cancer  This type of cancer can be detected and often prevented.  Routine colorectal cancer screening usually begins at 50 years of age and continues through 50 years of age.  Your health care provider may recommend screening at an earlier age if you have risk factors for colon cancer.  Your health care provider may also recommend using home test kits to check  for hidden blood in the stool.  A small camera at the end of a tube can be used to examine your colon directly (sigmoidoscopy or colonoscopy). This is done to check for the earliest forms of colorectal cancer.  Routine screening usually begins at age 27.  Direct examination of the colon should be repeated every 5-10 years through 50 years of age. However, you may need to be screened more often if early forms of precancerous polyps or small growths are found.  Skin Cancer  Check your skin from head to toe regularly.  Tell your health care provider about any new moles or changes in moles, especially if there is a change in a mole's shape or color.  Also tell your health care provider if you have a mole that is larger than the size of a pencil eraser.  Always use sunscreen. Apply sunscreen liberally and repeatedly throughout the day.  Protect yourself by wearing long sleeves, pants, a wide-brimmed hat, and sunglasses whenever you are outside.  Heart disease, diabetes, and high blood  pressure  High blood pressure causes heart disease and increases the risk of stroke. High blood pressure is more likely to develop in: ? People who have blood pressure in the high end of the normal range (130-139/85-89 mm Hg). ? People who are overweight or obese. ? People who are African American.  If you are 62-37 years of age, have your blood pressure checked every 3-5 years. If you are 23 years of age or older, have your blood pressure checked every year. You should have your blood pressure measured twice-once when you are at a hospital or clinic, and once when you are not at a hospital or clinic. Record the average of the two measurements. To check your blood pressure when you are not at a hospital or clinic, you can use: ? An automated blood pressure machine at a pharmacy. ? A home blood pressure monitor.  If you are between 27 years and 94 years old, ask your health care provider if you should take aspirin to prevent strokes.  Have regular diabetes screenings. This involves taking a blood sample to check your fasting blood sugar level. ? If you are at a normal weight and have a low risk for diabetes, have this test once every three years after 50 years of age. ? If you are overweight and have a high risk for diabetes, consider being tested at a younger age or more often. Preventing infection Hepatitis B  If you have a higher risk for hepatitis B, you should be screened for this virus. You are considered at high risk for hepatitis B if: ? You were born in a country where hepatitis B is common. Ask your health care provider which countries are considered high risk. ? Your parents were born in a high-risk country, and you have not been immunized against hepatitis B (hepatitis B vaccine). ? You have HIV or AIDS. ? You use needles to inject street drugs. ? You live with someone who has hepatitis B. ? You have had sex with someone who has hepatitis B. ? You get hemodialysis  treatment. ? You take certain medicines for conditions, including cancer, organ transplantation, and autoimmune conditions.  Hepatitis C  Blood testing is recommended for: ? Everyone born from [birth year] through 1963. ? Anyone with known risk factors for hepatitis C.  Sexually transmitted infections (STIs)  You should be screened for sexually transmitted infections (STIs) including gonorrhea and chlamydia if: ? You are  sexually active and are younger than 50 years of age. ? You are older than 50 years of age and your health care provider tells you that you are at risk for this type of infection. ? Your sexual activity has changed since you were last screened and you are at an increased risk for chlamydia or gonorrhea. Ask your health care provider if you are at risk.  If you do not have HIV, but are at risk, it may be recommended that you take a prescription medicine daily to prevent HIV infection. This is called pre-exposure prophylaxis (PrEP). You are considered at risk if: ? You are sexually active and do not regularly use condoms or know the HIV status of your partner(s). ? You take drugs by injection. ? You are sexually active with a partner who has HIV.  Talk with your health care provider about whether you are at high risk of being infected with HIV. If you choose to begin PrEP, you should first be tested for HIV. You should then be tested every 3 months for as long as you are taking PrEP. Pregnancy  If you are premenopausal and you may become pregnant, ask your health care provider about preconception counseling.  If you may become pregnant, take 400 to 800 micrograms (mcg) of folic acid every day.  If you want to prevent pregnancy, talk to your health care provider about birth control (contraception). Osteoporosis and menopause  Osteoporosis is a disease in which the bones lose minerals and strength with aging. This can result in serious bone fractures. Your risk for osteoporosis  can be identified using a bone density scan.  If you are 9 years of age or older, or if you are at risk for osteoporosis and fractures, ask your health care provider if you should be screened.  Ask your health care provider whether you should take a calcium or vitamin D supplement to lower your risk for osteoporosis.  Menopause may have certain physical symptoms and risks.  Hormone replacement therapy may reduce some of these symptoms and risks. Talk to your health care provider about whether hormone replacement therapy is right for you. Follow these instructions at home:  Schedule regular health, dental, and eye exams.  Stay current with your immunizations.  Do not use any tobacco products including cigarettes, chewing tobacco, or electronic cigarettes.  If you are pregnant, do not drink alcohol.  If you are breastfeeding, limit how much and how often you drink alcohol.  Limit alcohol intake to no more than 1 drink per day for nonpregnant women. One drink equals 12 ounces of beer, 5 ounces of wine, or 1 ounces of hard liquor.  Do not use street drugs.  Do not share needles.  Ask your health care provider for help if you need support or information about quitting drugs.  Tell your health care provider if you often feel depressed.  Tell your health care provider if you have ever been abused or do not feel safe at home. This information is not intended to replace advice given to you by your health care provider. Make sure you discuss any questions you have with your health care provider. Document Released: 06/14/2011 Document Revised: 05/06/2016 Document Reviewed: 09/02/2015 Elsevier Interactive Patient Education  [employer].   Let me know if rash doesn't resolve on its own; or certainly you have new or worsening features.

## 2018-07-14 NOTE — Assessment & Plan Note (Signed)
Etiology nonspecific at this time.  Duration 2 days.  No systemic features.  Patient and I jointly agreed on close vigilance at this time.  Does not resolve on its own, patient will let me know.

## 2018-07-14 NOTE — Assessment & Plan Note (Signed)
Lab ordered.

## 2018-07-14 NOTE — Assessment & Plan Note (Signed)
Clinical breast exam performed.  Mammogram ordered and patient understands to schedule.  Patient will also be due for colonoscopy later this year, this is been ordered.  Deferred pelvic exam as Pap is up-to-date and patient has no pelvic concerns today.  Discussed labs and patient would like to screen for abnormal labs.  Vitamin D, TSH, CMP have been ordered.

## 2018-07-14 NOTE — Progress Notes (Signed)
Subjective:    Patient ID: Kathleen Hull, female    DOB: 1966-09-28, 52 y.o.   MRN: [id number]  CC: Kathleen Hull is a 52 y.o. female who presents today for physical exam.    HPI:   Overall feels well today.    Does note a red, itchy rash for 2 days on right side nape of neck.  No known insect bite. No new lotions or creams.  It is not tender or painful.  No vesicular lesions, drainage, fever, chills.  She notes that she is at the beach this past week.  The rash is unchanged.  Has not tried any medications for this.  She also does complain about her weight and is interested in weight loss medication. She notes indiscretion with diet.  She is heard of Contrave.  Success with phentermine years ago with approximately 50 pound weight loss.  She does state that she gained this weight back however.   Depression-doing well on the Wellbutrin.  No SI/HI   H/o vitamin D deficiency     Colorectal Cancer Screening: due this December 2019 Breast Cancer Screening: Mammogram due  Cervical Cancer Screening: UTD Bone Health screening/DEXA for 65+: No increased fracture risk. Defer screening at this time. Lung Cancer Screening: Doesn't have 30 year pack year history and age > 73 years. Immunizations       Tetanus - utd       Labs: Screening labs today. Exercise: Gets regular exercise.  Alcohol use: none Smoking/tobacco use: Nonsmoker.  Regular dental exams: UTD Wears seat belt: Yes. Skin: follows with dermatology  HISTORY:  Past Medical History:  Diagnosis Date  . Squamous cell skin cancer 2013    Past Surgical History:  Procedure Laterality Date  . CHOLECYSTECTOMY     [place]  . VAGINAL DELIVERY     3  . WISDOM TOOTH EXTRACTION     Family History  Problem Relation Age of Onset  . Thyroid disease Mother   . Diabetes Father       ALLERGIES: Patient has no known allergies.  Current Outpatient Medications on File Prior to Visit  Medication Sig Dispense Refill  .  buPROPion (WELLBUTRIN XL) 150 MG 24 hr tablet TAKE 1 TABLET BY MOUTH EVERY DAY 30 tablet 0   Current Facility-Administered Medications on File Prior to Visit  Medication Dose Route Frequency Provider Last Rate Last Dose  . betamethasone acetate-betamethasone sodium phosphate (CELESTONE) injection 3 mg  3 mg Intramuscular Once Edrick Kins, DPM        Social History   Tobacco Use  . Smoking status: Never Smoker  . Smokeless tobacco: Never Used  Substance Use Topics  . Alcohol use: No  . Drug use: No    Review of Systems  Constitutional: Negative for chills, fever and unexpected weight change.  HENT: Negative for congestion.   Respiratory: Negative for cough.   Cardiovascular: Negative for chest pain, palpitations and leg swelling.  Gastrointestinal: Negative for nausea and vomiting.  Genitourinary: Negative for dyspareunia, dysuria and vaginal pain.  Musculoskeletal: Negative for arthralgias and myalgias.  Skin: Positive for rash.  Neurological: Negative for headaches.  Hematological: Negative for adenopathy.  Psychiatric/Behavioral: Negative for confusion and suicidal ideas.      Objective:    BP 124/90 (BP Location: Left Arm, Patient Position: Sitting, Cuff Size: Large)   Pulse 70   Temp 98.6 F (37 C) (Oral)   Resp 15   Ht 5' 4.5" (1.638 m)   Wt  231 lb 8 oz (105 kg)   SpO2 99%   BMI 39.12 kg/m   BP Readings from Last 3 Encounters:  07/14/18 124/90  06/21/17 (!) 164/109  06/13/17 (!) 136/98   Wt Readings from Last 3 Encounters:  07/14/18 231 lb 8 oz (105 kg)  06/13/17 234 lb (106.1 kg)  08/05/15 223 lb 8 oz (101.4 kg)    Physical Exam  Constitutional: She appears well-developed and well-nourished.  Eyes: Conjunctivae are normal.  Neck: No thyroid mass and no thyromegaly present.  Cardiovascular: Normal rate, regular rhythm, normal heart sounds and normal pulses.  Pulmonary/Chest: Effort normal and breath sounds normal. She has no wheezes. She has no  rhonchi. She has no rales. Right breast exhibits no inverted nipple, no mass, no nipple discharge, no skin change and no tenderness. Left breast exhibits no inverted nipple, no mass, no nipple discharge, no skin change and no tenderness. Breasts are symmetrical.  CBE performed.   Lymphadenopathy:       Head (right side): No submental, no submandibular, no tonsillar, no preauricular, no posterior auricular and no occipital adenopathy present.       Head (left side): No submental, no submandibular, no tonsillar, no preauricular, no posterior auricular and no occipital adenopathy present.    She has no cervical adenopathy.       Right cervical: No superficial cervical, no deep cervical and no posterior cervical adenopathy present.      Left cervical: No superficial cervical, no deep cervical and no posterior cervical adenopathy present.    She has no axillary adenopathy.  Neurological: She is alert.  Skin: Skin is warm and dry. Rash noted.     Approx 3 cm Linear raised welp noted right nape of neck. No increased warmth; it is non fluctuant, No streaking or discharge.    Psychiatric: She has a normal mood and affect. Her speech is normal and behavior is normal. Thought content normal.  Vitals reviewed.      Assessment & Plan:   Problem List Items Addressed This Visit      Musculoskeletal and Integument   Rash    Etiology nonspecific at this time.  Duration 2 days.  No systemic features.  Patient and I jointly agreed on close vigilance at this time.  Does not resolve on its own, patient will let me know.        Other   Routine general medical examination at a health care facility - Primary    Clinical breast exam performed.  Mammogram ordered and patient understands to schedule.  Patient will also be due for colonoscopy later this year, this is been ordered.  Deferred pelvic exam as Pap is up-to-date and patient has no pelvic concerns today.  Discussed labs and patient would like to screen  for abnormal labs.  Vitamin D, TSH, CMP have been ordered.      Relevant Orders   MM 3D SCREEN BREAST BILATERAL   Ambulatory referral to Gastroenterology   Comprehensive metabolic panel   TSH   VITAMIN D 25 Hydroxy (Vit-D Deficiency, Fractures)   Obesity (BMI 30-39.9)    Pending TSH.  Referral to Redgie Grayer.  Will follow      Relevant Orders   TSH   Amb Ref to Medical Weight Management   Vitamin D deficiency    Lab ordered      Relevant Orders   VITAMIN D 25 Hydroxy (Vit-D Deficiency, Fractures)       I have discontinued Quinn Axe.  [place]'s diclofenac and Ibuprofen-Famotidine. I am also having her maintain her buPROPion. We will continue to administer betamethasone acetate-betamethasone sodium phosphate.   No orders of the defined types were placed in this encounter.   Return precautions given.   Risks, benefits, and alternatives of the medications and treatment plan prescribed today were discussed, and patient expressed understanding.   Education regarding symptom management and diagnosis given to patient on AVS.   Continue to follow with Burnard Hawthorne, FNP for routine health maintenance.   Jannette Fogo and I agreed with plan.   Mable Paris, FNP

## 2018-07-20 ENCOUNTER — Other Ambulatory Visit: Payer: Self-pay | Admitting: Family

## 2018-07-20 DIAGNOSIS — N951 Menopausal and female climacteric states: Secondary | ICD-10-CM

## 2018-07-24 DIAGNOSIS — Z1283 Encounter for screening for malignant neoplasm of skin: Secondary | ICD-10-CM | POA: Diagnosis not present

## 2018-07-24 DIAGNOSIS — Z85828 Personal history of other malignant neoplasm of skin: Secondary | ICD-10-CM | POA: Diagnosis not present

## 2018-07-24 DIAGNOSIS — L739 Follicular disorder, unspecified: Secondary | ICD-10-CM | POA: Diagnosis not present

## 2018-08-01 ENCOUNTER — Encounter: Payer: Self-pay | Admitting: *Deleted

## 2018-10-20 DIAGNOSIS — Z23 Encounter for immunization: Secondary | ICD-10-CM | POA: Diagnosis not present

## 2019-01-19 ENCOUNTER — Telehealth: Payer: Self-pay | Admitting: Family

## 2019-01-19 NOTE — Telephone Encounter (Signed)
Call pt   Your mammogram appears due.   Please let me know once you have scheduled.     

## 2019-01-19 NOTE — Telephone Encounter (Signed)
Forwarding to Sarah.

## 2019-01-22 NOTE — Telephone Encounter (Signed)
I called & LM for patient stating that he mammogram was over due & I asked her to call to schedule. I provided number on VM & stated that if she had any further questions for [place] she could call back our office,

## 2019-03-07 ENCOUNTER — Other Ambulatory Visit: Payer: Self-pay | Admitting: Family

## 2019-03-07 DIAGNOSIS — N951 Menopausal and female climacteric states: Secondary | ICD-10-CM

## 2019-03-07 NOTE — Telephone Encounter (Signed)
Requested medication (s) are due for refill today: yes  Requested medication (s) are on the active medication list yes  Last refill:  07/20/18  Future visit scheduled: yes  Notes to clinic:  Not delegated    Requested Prescriptions  Pending Prescriptions Disp Refills   buPROPion (WELLBUTRIN XL) 150 MG 24 hr tablet 30 tablet 5    Sig: Take 1 tablet (150 mg total) by mouth daily.     Psychiatry: Antidepressants - bupropion Failed - 03/07/2019  2:06 PM      Failed - Last BP in normal range    BP Readings from Last 1 Encounters:  07/14/18 124/90         Failed - Valid encounter within last 6 months    Recent Outpatient Visits          7 months ago Routine general medical examination at a health care facility   [facility] Burnard Hawthorne, [place]   1 year ago Routine general medical examination at a health care facility   [facility] Burnard Hawthorne, [place]   3 years ago Menopausal symptoms   [place] Jackolyn Confer, MD   3 years ago Routine general medical examination at a health care facility   [facility] Jackolyn Confer, MD   5 years ago Routine general medical examination at a health care facility   [facility], Eduard Clos, MD      Future Appointments            In 4 months [place], Yvetta Coder, [place], [facility]

## 2019-03-09 MED ORDER — BUPROPION HCL ER (XL) 150 MG PO TB24: 150.0000 mg | ORAL_TABLET | Freq: Every day | ORAL | 5 refills | Status: DC

## 2019-04-04 ENCOUNTER — Encounter: Payer: Self-pay | Admitting: Emergency Medicine

## 2019-04-04 ENCOUNTER — Ambulatory Visit
Admission: EM | Admit: 2019-04-04 | Discharge: 2019-04-04 | Disposition: A | Discharge status: Home / Self Care | Payer: 59 | Attending: Family Medicine | Admitting: Family Medicine

## 2019-04-04 ENCOUNTER — Other Ambulatory Visit: Payer: Self-pay

## 2019-04-04 DIAGNOSIS — L255 Unspecified contact dermatitis due to plants, except food: Secondary | ICD-10-CM

## 2019-04-04 MED ORDER — PREDNISONE 10 MG PO TABS: ORAL_TABLET | ORAL | 0 refills | Status: DC

## 2019-04-04 NOTE — ED Triage Notes (Signed)
Patient c/o poison ivy to bilateral arms that started on Saturday.

## 2019-04-04 NOTE — Discharge Instructions (Signed)
Steroids as prescribed.  Calamine and benadryl if needed.  Take care  Dr. Lacinda Axon
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
## 2019-07-16 ENCOUNTER — Encounter: Payer: 59 | Admitting: Family

## 2019-08-03 ENCOUNTER — Other Ambulatory Visit: Payer: Self-pay

## 2019-08-06 ENCOUNTER — Ambulatory Visit (INDEPENDENT_AMBULATORY_CARE_PROVIDER_SITE_OTHER): Payer: 59 | Admitting: Family

## 2019-08-06 ENCOUNTER — Other Ambulatory Visit: Payer: Self-pay

## 2019-08-06 ENCOUNTER — Encounter: Payer: Self-pay | Admitting: Family

## 2019-08-06 VITALS — BP 126/78 | HR 83 | Temp 98.3°F | Ht 64.5 in | Wt 227.8 lb

## 2019-08-06 DIAGNOSIS — Z0000 Encounter for general adult medical examination without abnormal findings: Secondary | ICD-10-CM

## 2019-08-06 DIAGNOSIS — Z23 Encounter for immunization: Secondary | ICD-10-CM | POA: Diagnosis not present

## 2019-08-06 DIAGNOSIS — N951 Menopausal and female climacteric states: Secondary | ICD-10-CM | POA: Diagnosis not present

## 2019-08-06 MED ORDER — BUPROPION HCL ER (XL) 150 MG PO TB24: 150.0000 mg | ORAL_TABLET | Freq: Every morning | ORAL | 2 refills | Status: DC

## 2019-08-06 NOTE — Progress Notes (Signed)
Subjective:    Patient ID: ILENE RUGGLES, female    DOB: 1966-10-14, 53 y.o.   MRN: [id number]  CC: JAZLYNNE WININGS is a 53 y.o. female who presents today for physical exam.    HPI: Feels well no complaints.  Doing well on wellbutrin 150mg . May increase dose to 300mg .  Has been coping well. 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 Defer screening at this time        Tetanus - utd        Labs: Screening labs ordered. Exercise: Gets regular exercise.  Alcohol use: occasional Smoking/tobacco use: Nonsmoker.  Wears seat belt: Yes. Skin: h/o SCC; follows with dermatology  HISTORY:  Past Medical History:  Diagnosis Date  . Squamous cell skin cancer 2013    Past Surgical History:  Procedure Laterality Date  . CHOLECYSTECTOMY     [place]  . VAGINAL DELIVERY     3  . WISDOM TOOTH EXTRACTION     Family History  Problem Relation Age of Onset  . Thyroid disease Mother   . Diabetes Father   . Breast cancer Neg Hx   . Colon cancer Neg Hx       ALLERGIES: Patient has no known allergies.  No current outpatient medications on file prior to visit.   No current facility-administered medications on file prior to visit.     Social History   Tobacco Use  . Smoking status: Never Smoker  . Smokeless tobacco: Never Used  Substance Use Topics  . Alcohol use: No  . Drug use: No    Review of Systems  Constitutional: Negative for chills, fever and unexpected weight change.  HENT: Negative for congestion.   Respiratory: Negative for cough.   Cardiovascular: Negative for chest pain, palpitations and leg swelling.  Gastrointestinal: Negative for nausea and vomiting.  Musculoskeletal: Negative for arthralgias and myalgias.  Skin: Negative for rash.  Neurological: Negative for headaches.   Hematological: Negative for adenopathy.  Psychiatric/Behavioral: Negative for confusion.      Objective:    BP 126/78   Pulse 83   Temp 98.3 F (36.8 C) (Temporal)   Ht 5' 4.5" (1.638 m)   Wt 227 lb 12.8 oz (103.3 kg)   SpO2 97%   BMI 38.50 kg/m   BP Readings from Last 3 Encounters:  08/06/19 126/78  04/04/19 (!) 152/100  07/14/18 124/90   Wt Readings from Last 3 Encounters:  08/06/19 227 lb 12.8 oz (103.3 kg)  04/04/19 220 lb (99.8 kg)  07/14/18 231 lb 8 oz (105 kg)    Physical Exam Vitals signs reviewed.  Constitutional:      Appearance: She is well-developed.  Eyes:     Conjunctiva/sclera: Conjunctivae normal.  Neck:     Thyroid: No thyroid mass or thyromegaly.  Cardiovascular:     Rate and Rhythm: Normal rate and regular rhythm.     Pulses: Normal pulses.     Heart sounds: Normal heart sounds.  Pulmonary:     Effort: Pulmonary effort is normal.     Breath sounds: Normal breath sounds. No wheezing, rhonchi or rales.  Chest:     Breasts: Breasts are symmetrical.        Right: No inverted nipple, mass, nipple discharge, skin change or tenderness.        Left: No inverted nipple, mass,  nipple discharge, skin change or tenderness.  Lymphadenopathy:     Head:     Right side of head: No submental, submandibular, tonsillar, preauricular, posterior auricular or occipital adenopathy.     Left side of head: No submental, submandibular, tonsillar, preauricular, posterior auricular or occipital adenopathy.     Cervical: No cervical adenopathy.     Right cervical: No superficial, deep or posterior cervical adenopathy.    Left cervical: No superficial, deep or posterior cervical adenopathy.  Skin:    General: Skin is warm and dry.  Neurological:     Mental Status: She is alert.  Psychiatric:        Speech: Speech normal.        Behavior: Behavior normal.        Thought Content: Thought content normal.        Assessment & Plan:   Problem List Items Addressed  This Visit      Other   Routine general medical examination at a health care facility - Primary    CBE performed. Deferred pelvic exam in the absence of complaints of Pap smear is up-to-date.  Ordered Colonoscopy.  She will continue to follow with dermatology      Relevant Orders   CBC with Differential/Platelet   Comprehensive metabolic panel   Hemoglobin A1c   Lipid panel   TSH   VITAMIN D 25 Hydroxy (Vit-D Deficiency, Fractures)   Ambulatory referral to Gastroenterology   Menopausal symptoms    Doing well on Wellbutrin, we did discuss possibility of increasing to 300mg .  Patient will consider this for additional appetite suppression.  She will let me know.      Relevant Medications   buPROPion (WELLBUTRIN XL) 150 MG 24 hr tablet    Other Visit Diagnoses    Need for immunization against influenza       Relevant Orders   Flu Vaccine QUAD 36+ mos IM (Completed)       I have discontinued Quinn Axe. Deboard's predniSONE. I have also changed her buPROPion.   Meds ordered this encounter  Medications  . buPROPion (WELLBUTRIN XL) 150 MG 24 hr tablet    Sig: Take 1 tablet (150 mg total) by mouth every morning.    Dispense:  90 tablet    Refill:  2    Order Specific Question:   Supervising Provider    Answer:   Crecencio Mc [[id number]]    Return precautions given.   Risks, benefits, and alternatives of the medications and treatment plan prescribed today were discussed, and patient expressed understanding.   Education regarding symptom management and diagnosis given to patient on AVS.   Continue to follow with Burnard Hawthorne, FNP for routine health maintenance.   Jannette Fogo and I agreed with plan.   Mable Paris, FNP

## 2019-08-06 NOTE — Patient Instructions (Signed)
We will call you to schedule your fasting labs in the next several weeks.  Please call call and schedule your 3D mammogram at [facility] as discussed.  Stay safe!   Health Maintenance, Female Adopting a healthy lifestyle and getting preventive care are important in promoting health and wellness. Ask your health care provider about:  The right schedule for you to have regular tests and exams.  Things you can do on your own to prevent diseases and keep yourself healthy. What should I know about diet, weight, and exercise? Eat a healthy diet   Eat a diet that includes plenty of vegetables, fruits, low-fat dairy products, and lean protein.  Do not eat a lot of foods that are high in solid fats, added sugars, or sodium. Maintain a healthy weight Body mass index (BMI) is used to identify weight problems. It estimates body fat based on height and weight. Your health care provider can help determine your BMI and help you achieve or maintain a healthy weight. Get regular exercise Get regular exercise. This is one of the most important things you can do for your health. Most adults should:  Exercise for at least 150 minutes each week. The exercise should increase your heart rate and make you sweat (moderate-intensity exercise).  Do strengthening exercises at least twice a week. This is in addition to the moderate-intensity exercise.  Spend less time sitting. Even light physical activity can be beneficial. Watch cholesterol and blood lipids Have your blood tested for lipids and cholesterol at 51 years of age, then have this test every 5 years. Have your cholesterol levels checked more often if:  Your lipid or cholesterol levels are high.  You are older than 51 years of age.  You are at high risk for heart disease. What should I know about cancer screening? Depending on your health history and family history, you may need to have cancer screening at various ages. This may include  screening for:  Breast cancer.  Cervical cancer.  Colorectal cancer.  Skin cancer.  Lung cancer. What should I know about heart disease, diabetes, and high blood pressure? Blood pressure and heart disease  High blood pressure causes heart disease and increases the risk of stroke. This is more likely to develop in people who have high blood pressure readings, are of African descent, or are overweight.  Have your blood pressure checked: ? Every 3-5 years if you are 65-53 years of age. ? Every year if you are 80 years old or older. Diabetes Have regular diabetes screenings. This checks your fasting blood sugar level. Have the screening done:  Once every three years after age 77 if you are at a normal weight and have a low risk for diabetes.  More often and at a younger age if you are overweight or have a high risk for diabetes. What should I know about preventing infection? Hepatitis B If you have a higher risk for hepatitis B, you should be screened for this virus. Talk with your health care provider to find out if you are at risk for hepatitis B infection. Hepatitis C Testing is recommended for:  Everyone born from [birth year] through 1963.  Anyone with known risk factors for hepatitis C. Sexually transmitted infections (STIs)  Get screened for STIs, including gonorrhea and chlamydia, if: ? You are sexually active and are younger than 51 years of age. ? You are older than 51 years of age and your health care provider tells you that you are at  risk for this type of infection. ? Your sexual activity has changed since you were last screened, and you are at increased risk for chlamydia or gonorrhea. Ask your health care provider if you are at risk.  Ask your health care provider about whether you are at high risk for HIV. Your health care provider may recommend a prescription medicine to help prevent HIV infection. If you choose to take medicine to prevent HIV, you should first get  tested for HIV. You should then be tested every 3 months for as long as you are taking the medicine. Pregnancy  If you are about to stop having your period (premenopausal) and you may become pregnant, seek counseling before you get pregnant.  Take 400 to 800 micrograms (mcg) of folic acid every day if you become pregnant.  Ask for birth control (contraception) if you want to prevent pregnancy. Osteoporosis and menopause Osteoporosis is a disease in which the bones lose minerals and strength with aging. This can result in bone fractures. If you are 55 years old or older, or if you are at risk for osteoporosis and fractures, ask your health care provider if you should:  Be screened for bone loss.  Take a calcium or vitamin D supplement to lower your risk of fractures.  Be given hormone replacement therapy (HRT) to treat symptoms of menopause. Follow these instructions at home: Lifestyle  Do not use any products that contain nicotine or tobacco, such as cigarettes, e-cigarettes, and chewing tobacco. If you need help quitting, ask your health care provider.  Do not use street drugs.  Do not share needles.  Ask your health care provider for help if you need support or information about quitting drugs. Alcohol use  Do not drink alcohol if: ? Your health care provider tells you not to drink. ? You are pregnant, may be pregnant, or are planning to become pregnant.  If you drink alcohol: ? Limit how much you use to 0-1 drink a day. ? Limit intake if you are breastfeeding.  Be aware of how much alcohol is in your drink. In the [place] one drink equals one 12 oz bottle of beer (355 mL), one 5 oz glass of wine (148 mL), or one 1 oz glass of hard liquor (44 mL). General instructions  Schedule regular health, dental, and eye exams.  Stay current with your vaccines.  Tell your health care provider if: ? You often feel depressed. ? You have ever been abused or do not feel safe at home.  Summary  Adopting a healthy lifestyle and getting preventive care are important in promoting health and wellness.  Follow your health care provider's instructions about healthy diet, exercising, and getting tested or screened for diseases.  Follow your health care provider's instructions on monitoring your cholesterol and blood pressure. This information is not intended to replace advice given to you by your health care provider. Make sure you discuss any questions you have with your health care provider. Document Released: 06/14/2011 Document Revised: 11/22/2018 Document Reviewed: 11/22/2018 Elsevier Patient Education  2020 [employer].

## 2019-08-09 NOTE — Assessment & Plan Note (Signed)
Doing well on Wellbutrin, we did discuss possibility of increasing to 300mg .  Patient will consider this for additional appetite suppression.  She will let me know.

## 2019-08-09 NOTE — Assessment & Plan Note (Signed)
CBE performed. Deferred pelvic exam in the absence of complaints of Pap smear is up-to-date.  Ordered Colonoscopy.  She will continue to follow with dermatology

## 2019-08-24 ENCOUNTER — Other Ambulatory Visit: Payer: Self-pay | Admitting: Family

## 2019-08-24 NOTE — Telephone Encounter (Signed)
RX REFILL buPROPion (WELLBUTRIN XL) 150 MG 24 hr tablet  Patient is requesting to get 300mg  pills. PHARMACY [id number] [address], [place] [phone] (Phone) [phone] (Fax)

## 2019-08-27 ENCOUNTER — Other Ambulatory Visit: Payer: Self-pay

## 2019-08-27 MED ORDER — BUPROPION HCL ER (XL) 300 MG PO TB24: 300.0000 mg | ORAL_TABLET | Freq: Every day | ORAL | 1 refills | Status: DC

## 2019-08-27 NOTE — Telephone Encounter (Signed)
Med refill changed & sent per last OV notes.

## 2019-09-13 ENCOUNTER — Encounter: Payer: Self-pay | Admitting: *Deleted

## 2020-02-23 ENCOUNTER — Ambulatory Visit: Payer: 59

## 2020-02-24 ENCOUNTER — Other Ambulatory Visit: Payer: Self-pay | Admitting: Family

## 2020-03-10 ENCOUNTER — Encounter: Payer: Self-pay | Admitting: Family

## 2020-03-13 ENCOUNTER — Telehealth: Payer: 59 | Admitting: Internal Medicine

## 2020-03-18 ENCOUNTER — Ambulatory Visit (INDEPENDENT_AMBULATORY_CARE_PROVIDER_SITE_OTHER): Payer: 59 | Admitting: Internal Medicine

## 2020-03-18 ENCOUNTER — Other Ambulatory Visit: Payer: Self-pay

## 2020-03-18 ENCOUNTER — Encounter: Payer: Self-pay | Admitting: Internal Medicine

## 2020-03-18 VITALS — BP 140/90 | HR 78 | Temp 97.5°F | Ht 64.5 in | Wt 213.6 lb

## 2020-03-18 DIAGNOSIS — L22 Diaper dermatitis: Secondary | ICD-10-CM | POA: Diagnosis not present

## 2020-03-18 DIAGNOSIS — B80 Enterobiasis: Secondary | ICD-10-CM | POA: Diagnosis not present

## 2020-03-18 DIAGNOSIS — N760 Acute vaginitis: Secondary | ICD-10-CM | POA: Diagnosis not present

## 2020-03-18 MED ORDER — CLOTRIMAZOLE 1 % EX CREA: 1.0000 "application " | TOPICAL_CREAM | Freq: Two times a day (BID) | CUTANEOUS | 2 refills | Status: DC

## 2020-03-18 MED ORDER — FLUCONAZOLE 150 MG PO TABS: 150.0000 mg | ORAL_TABLET | ORAL | 0 refills | Status: DC

## 2020-03-18 MED ORDER — HYDROCORTISONE 2.5 % EX CREA: TOPICAL_CREAM | Freq: Two times a day (BID) | CUTANEOUS | 2 refills | Status: DC

## 2020-03-18 MED ORDER — MEBENDAZOLE 100 MG PO CHEW: 100.0000 mg | CHEWABLE_TABLET | ORAL | 0 refills | Status: DC

## 2020-03-18 MED ORDER — HYDROCORTISONE (PERIANAL) 2.5 % EX CREA: 1.0000 "application " | TOPICAL_CREAM | Freq: Two times a day (BID) | CUTANEOUS | 2 refills | Status: DC

## 2020-03-18 NOTE — Progress Notes (Signed)
Chief Complaint  Patient presents with  . Anal Itching    Started off with anal itching and discomfort that has since spread to the vaginal area. 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 has not seen any evidense of worms but has started to treat for worms OTC. Also considered hemrroids and treatd this otc too. No OTC treatments are working. Pt saw that cashews can cause anal itching and she eats these alot. She stopped eating these but still having symptoms.   . Vaginal Itching   F/u  1. 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 She is using dove soap, only new food would be eating cashews and this happens to sister as well, detergents are the same. Recently changed from cheap tissue to charmin still having anal and vaginal itching but less. She also reports using poise pads while at work due to urinary incontinence at times    Review of Systems  Respiratory: Negative for shortness of breath.   Cardiovascular: Negative for chest pain.  Genitourinary:       +incontinence if holds urine too long   Skin: Positive for itching and rash.   Past Medical History:  Diagnosis Date  . Squamous cell skin cancer 2013   Past Surgical History:  Procedure Laterality Date  . CHOLECYSTECTOMY     [place]  . VAGINAL DELIVERY     3  . WISDOM TOOTH EXTRACTION     Family History  Problem Relation Age of Onset  . Thyroid disease Mother   . Diabetes Father   . Breast cancer Neg Hx   . Colon cancer Neg Hx    Social History   Socioeconomic History  . Marital status: Married    Spouse name: Not on file  . Number of children: Not on file  . Years of education: Not on file  . Highest education level: Not on file  Occupational History  . Not on file  Tobacco Use  . Smoking status: Never Smoker  .  Smokeless tobacco: Never Used  Substance and Sexual Activity  . Alcohol use: No  . Drug use: No  . Sexual activity: Not on file  Other Topics Concern  . Not on file  Social History Narrative   Lives in [place] with husband and 3 children.      Work - works at [employer], Print production planner      [place], regular      Exercise - walking   Social Determinants of Radio broadcast assistant Strain:   . Difficulty of Paying Living Expenses:   Food Insecurity:   . Worried About Charity fundraiser in the Last Year:   . Arboriculturist in the Last Year:   Transportation Needs:   . Film/video editor (Medical):   Marland Kitchen Lack of Transportation (Non-Medical):   Physical Activity:   . Days of Exercise per Week:   . Minutes of Exercise per Session:   Stress:   . Feeling of Stress :   Social Connections:   . Frequency of Communication with Friends and Family:   . Frequency of Social Gatherings with Friends and Family:   . Attends Religious Services:   . Active Member of Clubs or Organizations:   . Attends  Club or Organization Meetings:   Marland Kitchen Marital Status:   Intimate Partner Violence:   . Fear of Current or Ex-Partner:   . Emotionally Abused:   Marland Kitchen Physically Abused:   . Sexually Abused:    Current Meds  Medication Sig  . buPROPion (WELLBUTRIN XL) 300 MG 24 hr tablet TAKE 1 TABLET BY MOUTH EVERY DAY   No Known Allergies No results found for this or any previous visit (from the past 2160 hour(s)). Objective  Body mass index is 36.1 kg/m. Wt Readings from Last 3 Encounters:  03/18/20 213 lb 9.6 oz (96.9 kg)  08/06/19 227 lb 12.8 oz (103.3 kg)  04/04/19 220 lb (99.8 kg)   Temp Readings from Last 3 Encounters:  03/18/20 (!) 97.5 F (36.4 C) (Temporal)  08/06/19 98.3 F (36.8 C) (Temporal)  04/04/19 98 F (36.7 C) (Oral)   BP Readings from Last 3 Encounters:  03/18/20 140/90  08/06/19 126/78  04/04/19 (!) 152/100   Pulse Readings from Last 3 Encounters:  03/18/20  78  08/06/19 83  04/04/19 81    Physical Exam Vitals and nursing note reviewed.  Constitutional:      Appearance: Normal appearance. She is well-developed and well-groomed.  HENT:     Head: Normocephalic and atraumatic.  Eyes:     Conjunctiva/sclera: Conjunctivae normal.     Pupils: Pupils are equal, round, and reactive to light.  Cardiovascular:     Rate and Rhythm: Normal rate and regular rhythm.     Heart sounds: Normal heart sounds. No murmur.  Pulmonary:     Effort: Pulmonary effort is normal.     Breath sounds: Normal breath sounds.  Genitourinary:    Pubic Area: Rash present.     Labia:        Right: Rash present.        Left: Rash present.      Comments: Vaginitis vulvar  Diaper dermatitis anus with satellite lesions  Skin:    General: Skin is warm and dry.  Neurological:     General: No focal deficit present.     Mental Status: She is alert and oriented to person, place, and time. Mental status is at baseline.     Gait: Gait normal.  Psychiatric:        Attention and Perception: Attention and perception normal.        Mood and Affect: Mood and affect normal.        Speech: Speech normal.        Behavior: Behavior normal. Behavior is cooperative.        Thought Content: Thought content normal.        Cognition and Memory: Cognition and memory normal.        Judgment: Judgment normal.     Assessment  Plan  Diaper dermatitis - Plan: hydrocortisone (ANUSOL-HC) 2.5 % rectal cream, hydrocortisone 2.5 % cream, clotrimazole (LOTRIMIN) 1 % cream, fluconazole (DIFLUCAN) 150 MG tablet  Vulvovaginitis - Plan: hydrocortisone 2.5 % cream, clotrimazole (LOTRIMIN) 1 % cream, fluconazole (DIFLUCAN) 150 MG tablet Check toilet paper for MCI/MI ingredient  Try otc butt paste A&D/desitin for above   Pinworm infection - Plan: mebendazole (VERMOX) 100 MG chewable tablet  X 1 does week 1 and another dose week 2 prn in 2 weeks if not better   Provider: Dr. Olivia Mackie  [facility]

## 2020-03-18 NOTE — Patient Instructions (Addendum)
desitin or butt paste or A&D oint with vasoline and zinc use in anal region    Watch out for MCI/MI in toilet paper  Methylcholorosiothiazodinone/methylisothiazolinone in toilet paper   Take Mebendazole 1x per week x 2 weeks if this does not help

## 2020-04-15 ENCOUNTER — Other Ambulatory Visit: Payer: Self-pay | Admitting: Family Medicine

## 2020-04-15 DIAGNOSIS — Z1231 Encounter for screening mammogram for malignant neoplasm of breast: Secondary | ICD-10-CM

## 2020-04-30 ENCOUNTER — Other Ambulatory Visit: Payer: Self-pay

## 2020-04-30 ENCOUNTER — Encounter: Payer: Self-pay | Admitting: Emergency Medicine

## 2020-04-30 ENCOUNTER — Ambulatory Visit
Admission: EM | Admit: 2020-04-30 | Discharge: 2020-04-30 | Disposition: A | Discharge status: Home / Self Care | Payer: 59 | Attending: Family Medicine | Admitting: Family Medicine

## 2020-04-30 DIAGNOSIS — H6692 Otitis media, unspecified, left ear: Secondary | ICD-10-CM

## 2020-04-30 HISTORY — DX: Anxiety disorder, unspecified: F41.9

## 2020-04-30 MED ORDER — AMOXICILLIN 875 MG PO TABS: 875.0000 mg | ORAL_TABLET | Freq: Two times a day (BID) | ORAL | 0 refills | Status: DC

## 2020-04-30 NOTE — ED Provider Notes (Signed)
[facility] ____________________________________________  Time seen: Approximately 1:26 PM  I have reviewed the triage vital signs and the nursing notes.   HISTORY  Chief Complaint Otalgia (left)   HPI Kathleen Hull is a 54 y.o. female presenting for evaluation of 3 days of left ear pain.  States pain is currently mild to moderate.  Denies hearing changes, ear drainage.  Feels like there is some congestion in her ear but nothing coming out.  Denies any cough, congestion, fevers.  Does not to have seasonal allergies.  Has been working outside.  Denies injury or ringing in ear.  Continues eat and drink well.  Denies aggravating or alleviating factors.  Reports otherwise doing well.  Burnard Hawthorne, FNP : PCP  Past Medical History:  Diagnosis Date  . Anxiety   . Squamous cell skin cancer 2013    Patient Active Problem List   Diagnosis Date Noted  . Vitamin D deficiency 07/14/2018  . Rash 07/14/2018  . Thyroid disorder 08/05/2015  . Pain in the chest 08/05/2015  . Obesity (BMI 30-39.9) 07/07/2015  . Menopausal symptoms 07/07/2015  . Routine general medical examination at a health care facility 09/11/2013  . Squamous cell carcinoma, trunk 09/11/2013  . Urge incontinence 09/11/2013  . Screening for breast cancer 02/22/2013    Past Surgical History:  Procedure Laterality Date  . CHOLECYSTECTOMY     [place]  . VAGINAL DELIVERY     3  . WISDOM TOOTH EXTRACTION       No current facility-administered medications for this encounter.  Current Outpatient Medications:  .  buPROPion (WELLBUTRIN XL) 300 MG 24 hr tablet, TAKE 1 TABLET BY MOUTH EVERY DAY, Disp: 30 tablet, Rfl: 5 .  amoxicillin (AMOXIL) 875 MG tablet, Take 1 tablet (875 mg total) by mouth 2 (two) times daily., Disp: 20 tablet, Rfl: 0 .  clotrimazole (LOTRIMIN) 1 % cream, Apply 1 application topically 2 (two) times daily., Disp: 60 g, Rfl: 2 .  fluconazole (DIFLUCAN) 150 MG tablet, Take 1  tablet (150 mg total) by mouth once a week. X 2 weeks, Disp: 2 tablet, Rfl: 0 .  hydrocortisone (ANUSOL-HC) 2.5 % rectal cream, Place 1 application rectally 2 (two) times daily., Disp: 60 g, Rfl: 2 .  hydrocortisone 2.5 % cream, Apply topically 2 (two) times daily., Disp: 60 g, Rfl: 2 .  mebendazole (VERMOX) 100 MG chewable tablet, Chew 1 tablet (100 mg total) by mouth once a week. Repeat in 1 more week, Disp: 2 tablet, Rfl: 0  Allergies Patient has no known allergies.  Family History  Problem Relation Age of Onset  . Thyroid disease Mother   . Diabetes Father   . Breast cancer Neg Hx   . Colon cancer Neg Hx     Social History Social History   Tobacco Use  . Smoking status: Never Smoker  . Smokeless tobacco: Never Used  Substance Use Topics  . Alcohol use: No  . Drug use: No    Review of Systems Constitutional: No fever. ENT: No sore throat.  Positive ear pain. Cardiovascular: Denies chest pain. Respiratory: Denies shortness of breath. Musculoskeletal: Negative for back pain. Skin: Negative for rash. _________________________________________   PHYSICAL EXAM:  VITAL SIGNS: ED Triage Vitals  Enc Vitals Group     BP 04/30/20 1309 (!) 148/91     Pulse Rate 04/30/20 1309 83     Resp 04/30/20 1309 18     Temp 04/30/20 1309 97.8 F (36.6 C)  Temp Source 04/30/20 1309 Oral     SpO2 04/30/20 1309 100 %     Weight 04/30/20 1309 214 lb (97.1 kg)     Height 04/30/20 1309 5\' 4"  (1.626 m)     Head Circumference --      Peak Flow --      Pain Score 04/30/20 1308 5     Pain Loc --      Pain Edu? --      Excl. in [place]? --     Constitutional: Alert and oriented. Well appearing and in no acute distress. Eyes: Conjunctivae are normal.  Head: Atraumatic.  No swelling. No erythema.  Ears: Left: Mild pain with auricle movement, mild cerumen in canal, mild effusion with moderate erythema, TM otherwise dull.  Right: Nontender, normal canal, no erythema, normal  TM. Hematological/Lymphatic/Immunilogical: No cervical lymphadenopathy. Respiratory: Normal respiratory effort.  Musculoskeletal: Ambulatory with steady gait.  Neurologic:  Normal speech and language. No gait instability. Skin:  Skin appears warm, dry and intact. No rash noted. Psychiatric: Mood and affect are normal. Speech and behavior are normal.  ___________________________________________   LABS (all labs ordered are listed, but only abnormal results are displayed)  Labs Reviewed - No data to display   PROCEDURES Procedures    INITIAL IMPRESSION / ASSESSMENT AND PLAN / ED COURSE  Pertinent labs & imaging results that were available during my care of the patient were reviewed by me and considered in my medical decision making (see chart for details).  Well-appearing patient.  No acute distress.  Left otalgia.  Left otitis with effusion noted.  Over-the-counter antihistamine, Rx amoxicillin.  Encourage rest, fluids, supportive care.Discussed indication, risks and benefits of medications with patient.   Discussed follow up with Primary care physician this week. Discussed follow up and return parameters including no resolution or any worsening concerns. Patient verbalized understanding and agreed to plan.   ____________________________________________   FINAL CLINICAL IMPRESSION(S) / ED DIAGNOSES  Final diagnoses:  Left otitis media, unspecified otitis media type     ED Discharge Orders         Ordered    amoxicillin (AMOXIL) 875 MG tablet  2 times daily     04/30/20 1326           Note: This dictation was prepared with Dragon dictation along with smaller phrase technology. Any transcriptional errors that result from this process are unintentional.         Marylene Land, NP 04/30/20 1329

## 2020-04-30 NOTE — ED Triage Notes (Signed)
Patient in today c/o left ear pain x 3 days. Patient denies fever or any other symptoms. Patient has not taken any OTC medications.

## 2020-04-30 NOTE — Discharge Instructions (Addendum)
Take medication as prescribed.Over the counter antihistamine.  Tylenol or ibuprofen as needed.  Follow up with your primary care physician this week as needed. Return to Urgent care for new or worsening concerns.

## 2020-05-14 ENCOUNTER — Ambulatory Visit: Payer: 59

## 2020-05-19 ENCOUNTER — Other Ambulatory Visit: Payer: Self-pay

## 2020-05-19 ENCOUNTER — Other Ambulatory Visit: Payer: Self-pay | Admitting: Family

## 2020-05-19 ENCOUNTER — Ambulatory Visit
Admission: RE | Admit: 2020-05-19 | Discharge: 2020-05-19 | Disposition: A | Discharge status: Home / Self Care | Payer: 59 | Source: Ambulatory Visit | Attending: Family Medicine | Admitting: Family Medicine

## 2020-05-19 DIAGNOSIS — Z1231 Encounter for screening mammogram for malignant neoplasm of breast: Secondary | ICD-10-CM

## 2020-05-22 ENCOUNTER — Encounter: Payer: Self-pay | Admitting: Family

## 2020-05-23 NOTE — Telephone Encounter (Signed)
Called patient and scheduled an appointment for High blood pressure with Joelene Millin mills at 8:30 am on Tuesday 05/27/2020

## 2020-05-27 ENCOUNTER — Ambulatory Visit: Payer: 59 | Admitting: Nurse Practitioner

## 2020-06-19 DIAGNOSIS — M159 Polyosteoarthritis, unspecified: Secondary | ICD-10-CM | POA: Insufficient documentation

## 2020-06-19 DIAGNOSIS — N289 Disorder of kidney and ureter, unspecified: Secondary | ICD-10-CM | POA: Insufficient documentation

## 2020-06-19 DIAGNOSIS — E870 Hyperosmolality and hypernatremia: Secondary | ICD-10-CM | POA: Insufficient documentation

## 2020-06-19 DIAGNOSIS — R609 Edema, unspecified: Secondary | ICD-10-CM | POA: Insufficient documentation

## 2020-07-22 DIAGNOSIS — N1832 Chronic kidney disease, stage 3b (CMS HCC): Secondary | ICD-10-CM | POA: Insufficient documentation

## 2020-08-08 ENCOUNTER — Encounter: Payer: Self-pay | Admitting: Family

## 2020-08-08 ENCOUNTER — Other Ambulatory Visit (HOSPITAL_COMMUNITY)
Admission: RE | Admit: 2020-08-08 | Discharge: 2020-08-08 | Disposition: A | Discharge status: Home / Self Care | Payer: 59 | Source: Ambulatory Visit | Attending: Family | Admitting: Family

## 2020-08-08 ENCOUNTER — Ambulatory Visit (INDEPENDENT_AMBULATORY_CARE_PROVIDER_SITE_OTHER): Payer: 59 | Admitting: Family

## 2020-08-08 ENCOUNTER — Other Ambulatory Visit: Payer: Self-pay

## 2020-08-08 VITALS — BP 142/84 | HR 84 | Temp 97.9°F | Ht 64.5 in | Wt 211.6 lb

## 2020-08-08 DIAGNOSIS — Z0000 Encounter for general adult medical examination without abnormal findings: Secondary | ICD-10-CM | POA: Diagnosis present

## 2020-08-08 DIAGNOSIS — I10 Essential (primary) hypertension: Secondary | ICD-10-CM | POA: Insufficient documentation

## 2020-08-08 MED ORDER — AMLODIPINE BESYLATE 2.5 MG PO TABS: 2.5000 mg | ORAL_TABLET | Freq: Every day | ORAL | 3 refills | Status: DC

## 2020-08-08 NOTE — Patient Instructions (Signed)
Start amlodipine 2.5mg   It is imperative that you are seen AT least twice per year for labs and monitoring when on blood pressure medications. Monitor blood pressure at home and me 5-6 reading on separate days. Goal is less than 120/80, based on newest guidelines, however we certainly want to be less than 130/80;  if persistently higher, please make sooner follow up appointment so we can recheck you blood pressure and manage/ adjust medications.   Managing Your Hypertension Hypertension is commonly called high blood pressure. This is when the force of your blood pressing against the walls of your arteries is too strong. Arteries are blood vessels that carry blood from your heart throughout your body. Hypertension forces the heart to work harder to pump blood, and may cause the arteries to become narrow or stiff. Having untreated or uncontrolled hypertension can cause heart attack, stroke, kidney disease, and other problems. What are blood pressure readings? A blood pressure reading consists of a higher number over a lower number. Ideally, your blood pressure should be below 120/80. The first ("top") number is called the systolic pressure. It is a measure of the pressure in your arteries as your heart beats. The second ("bottom") number is called the diastolic pressure. It is a measure of the pressure in your arteries as the heart relaxes. What does my blood pressure reading mean? Blood pressure is classified into four stages. Based on your blood pressure reading, your health care provider may use the following stages to determine what type of treatment you need, if any. Systolic pressure and diastolic pressure are measured in a unit called mm Hg. Normal  Systolic pressure: below 277.  Diastolic pressure: below 80. Elevated  Systolic pressure: 824-235.  Diastolic pressure: below 80. Hypertension stage 1  Systolic pressure: 361-443.  Diastolic pressure: 15-40. Hypertension stage 2  Systolic  pressure: 086 or above.  Diastolic pressure: 90 or above. What health risks are associated with hypertension? Managing your hypertension is an important responsibility. Uncontrolled hypertension can lead to:  A heart attack.  A stroke.  A weakened blood vessel (aneurysm).  Heart failure.  Kidney damage.  Eye damage.  Metabolic syndrome.  Memory and concentration problems. What changes can I make to manage my hypertension? Hypertension can be managed by making lifestyle changes and possibly by taking medicines. Your health care provider will help you make a plan to bring your blood pressure within a normal range. Eating and drinking   Eat a diet that is high in fiber and potassium, and low in salt (sodium), added sugar, and fat. An example eating plan is called the DASH (Dietary Approaches to Stop Hypertension) diet. To eat this way: ? Eat plenty of fresh fruits and vegetables. Try to fill half of your plate at each meal with fruits and vegetables. ? Eat whole grains, such as whole wheat pasta, brown rice, or whole grain bread. Fill about one quarter of your plate with whole grains. ? Eat low-fat diary products. ? Avoid fatty cuts of meat, processed or cured meats, and poultry with skin. Fill about one quarter of your plate with lean proteins such as fish, chicken without skin, beans, eggs, and tofu. ? Avoid premade and processed foods. These tend to be higher in sodium, added sugar, and fat.  Reduce your daily sodium intake. Most people with hypertension should eat less than 1,500 mg of sodium a day.  Limit alcohol intake to no more than 1 drink a day for nonpregnant women and 2 drinks a  day for men. One drink equals 12 oz of beer, 5 oz of wine, or 1 oz of hard liquor. Lifestyle  Work with your health care provider to maintain a healthy body weight, or to lose weight. Ask what an ideal weight is for you.  Get at least 30 minutes of exercise that causes your heart to beat  faster (aerobic exercise) most days of the week. Activities may include walking, swimming, or biking.  Include exercise to strengthen your muscles (resistance exercise), such as weight lifting, as part of your weekly exercise routine. Try to do these types of exercises for 30 minutes at least 3 days a week.  Do not use any products that contain nicotine or tobacco, such as cigarettes and e-cigarettes. If you need help quitting, ask your health care provider.  Control any long-term (chronic) conditions you have, such as high cholesterol or diabetes. Monitoring  Monitor your blood pressure at home as told by your health care provider. Your personal target blood pressure may vary depending on your medical conditions, your age, and other factors.  Have your blood pressure checked regularly, as often as told by your health care provider. Working with your health care provider  Review all the medicines you take with your health care provider because there may be side effects or interactions.  Talk with your health care provider about your diet, exercise habits, and other lifestyle factors that may be contributing to hypertension.  Visit your health care provider regularly. Your health care provider can help you create and adjust your plan for managing hypertension. Will I need medicine to control my blood pressure? Your health care provider may prescribe medicine if lifestyle changes are not enough to get your blood pressure under control, and if:  Your systolic blood pressure is 130 or higher.  Your diastolic blood pressure is 80 or higher. Take medicines only as told by your health care provider. Follow the directions carefully. Blood pressure medicines must be taken as prescribed. The medicine does not work as well when you skip doses. Skipping doses also puts you at risk for problems. Contact a health care provider if:  You think you are having a reaction to medicines you have taken.  You  have repeated (recurrent) headaches.  You feel dizzy.  You have swelling in your ankles.  You have trouble with your vision. Get help right away if:  You develop a severe headache or confusion.  You have unusual weakness or numbness, or you feel faint.  You have severe pain in your chest or abdomen.  You vomit repeatedly.  You have trouble breathing. Summary  Hypertension is when the force of blood pumping through your arteries is too strong. If this condition is not controlled, it may put you at risk for serious complications.  Your personal target blood pressure may vary depending on your medical conditions, your age, and other factors. For most people, a normal blood pressure is less than 120/80.  Hypertension is managed by lifestyle changes, medicines, or both. Lifestyle changes include weight loss, eating a healthy, low-sodium diet, exercising more, and limiting alcohol. This information is not intended to replace advice given to you by your health care provider. Make sure you discuss any questions you have with your health care provider. Document Revised: 03/23/2019 Document Reviewed: 10/27/2016 Elsevier Patient Education  [place] Maintenance for Postmenopausal Women Menopause is a normal process in which your ability to get pregnant comes to an end. This process  happens slowly over many months or years, usually between the ages of 65 and 58. Menopause is complete when you have missed your menstrual periods for 12 months. It is important to talk with your health care provider about some of the most common conditions that affect women after menopause (postmenopausal women). These include heart disease, cancer, and bone loss (osteoporosis). Adopting a healthy lifestyle and getting preventive care can help to promote your health and wellness. The actions you take can also lower your chances of developing some of these common conditions. What should I know  about menopause? During menopause, you may get a number of symptoms, such as:  Hot flashes. These can be moderate or severe.  Night sweats.  Decrease in sex drive.  Mood swings.  Headaches.  Tiredness.  Irritability.  Memory problems.  Insomnia. Choosing to treat or not to treat these symptoms is a decision that you make with your health care provider. Do I need hormone replacement therapy?  Hormone replacement therapy is effective in treating symptoms that are caused by menopause, such as hot flashes and night sweats.  Hormone replacement carries certain risks, especially as you become older. If you are thinking about using estrogen or estrogen with progestin, discuss the benefits and risks with your health care provider. What is my risk for heart disease and stroke? The risk of heart disease, heart attack, and stroke increases as you age. One of the causes may be a change in the body's hormones during menopause. This can affect how your body uses dietary fats, triglycerides, and cholesterol. Heart attack and stroke are medical emergencies. There are many things that you can do to help prevent heart disease and stroke. Watch your blood pressure  High blood pressure causes heart disease and increases the risk of stroke. This is more likely to develop in people who have high blood pressure readings, are of African descent, or are overweight.  Have your blood pressure checked: ? Every 3-5 years if you are 99-11 years of age. ? Every year if you are 15 years old or older. Eat a healthy diet   Eat a diet that includes plenty of vegetables, fruits, low-fat dairy products, and lean protein.  Do not eat a lot of foods that are high in solid fats, added sugars, or sodium. Get regular exercise Get regular exercise. This is one of the most important things you can do for your health. Most adults should:  Try to exercise for at least 150 minutes each week. The exercise should  increase your heart rate and make you sweat (moderate-intensity exercise).  Try to do strengthening exercises at least twice each week. Do these in addition to the moderate-intensity exercise.  Spend less time sitting. Even light physical activity can be beneficial. Other tips  Work with your health care provider to achieve or maintain a healthy weight.  Do not use any products that contain nicotine or tobacco, such as cigarettes, e-cigarettes, and chewing tobacco. If you need help quitting, ask your health care provider.  Know your numbers. Ask your health care provider to check your cholesterol and your blood sugar (glucose). Continue to have your blood tested as directed by your health care provider. Do I need screening for cancer? Depending on your health history and family history, you may need to have cancer screening at different stages of your life. This may include screening for:  Breast cancer.  Cervical cancer.  Lung cancer.  Colorectal cancer. What is my risk for osteoporosis?  After menopause, you may be at increased risk for osteoporosis. Osteoporosis is a condition in which bone destruction happens more quickly than new bone creation. To help prevent osteoporosis or the bone fractures that can happen because of osteoporosis, you may take the following actions:  If you are 67-47 years old, get at least 1,000 mg of calcium and at least 600 mg of vitamin D per day.  If you are older than age 7 but younger than age 81, get at least 1,200 mg of calcium and at least 600 mg of vitamin D per day.  If you are older than age 5, get at least 1,200 mg of calcium and at least 800 mg of vitamin D per day. Smoking and drinking excessive alcohol increase the risk of osteoporosis. Eat foods that are rich in calcium and vitamin D, and do weight-bearing exercises several times each week as directed by your health care provider. How does menopause affect my mental health? Depression may  occur at any age, but it is more common as you become older. Common symptoms of depression include:  Low or sad mood.  Changes in sleep patterns.  Changes in appetite or eating patterns.  Feeling an overall lack of motivation or enjoyment of activities that you previously enjoyed.  Frequent crying spells. Talk with your health care provider if you think that you are experiencing depression. General instructions See your health care provider for regular wellness exams and vaccines. This may include:  Scheduling regular health, dental, and eye exams.  Getting and maintaining your vaccines. These include: ? Influenza vaccine. Get this vaccine each year before the flu season begins. ? Pneumonia vaccine. ? Shingles vaccine. ? Tetanus, diphtheria, and pertussis (Tdap) booster vaccine. Your health care provider may also recommend other immunizations. Tell your health care provider if you have ever been abused or do not feel safe at home. Summary  Menopause is a normal process in which your ability to get pregnant comes to an end.  This condition causes hot flashes, night sweats, decreased interest in sex, mood swings, headaches, or lack of sleep.  Treatment for this condition may include hormone replacement therapy.  Take actions to keep yourself healthy, including exercising regularly, eating a healthy diet, watching your weight, and checking your blood pressure and blood sugar levels.  Get screened for cancer and depression. Make sure that you are up to date with all your vaccines. This information is not intended to replace advice given to you by your health care provider. Make sure you discuss any questions you have with your health care provider. Document Revised: 11/22/2018 Document Reviewed: 11/22/2018 Elsevier Patient Education  2020 [employer].

## 2020-08-08 NOTE — Assessment & Plan Note (Signed)
CBE and pap smear performed today. Referral for colonoscopy.

## 2020-08-08 NOTE — Assessment & Plan Note (Signed)
Elevated. Start amlodipine.

## 2020-08-08 NOTE — Progress Notes (Signed)
Subjective:    Patient ID: WANNA GULLY, female    DOB: 06/01/1966, 54 y.o.   MRN: [id number]  CC: ARZU MCGAUGHEY is a 54 y.o. female who presents today for physical exam.    HPI: Feels well today No complaints  No h/o HTN however over the past couple of months blood pressure has been in 130/80s range at home and in doctors appointments. No cp, sob. She has never been on antihypertensive.  She doesn't add salt to foods. Intentional weight loss through healthier foods. Has stopped drinking soda  Feels well on Wellbutrin . Started this medication for irritability and anxiety.    Colorectal Cancer Screening: due Breast Cancer Screening: Mammogram UTD Cervical Cancer Screening: due. No longer having periods. NO pelvic pain        Tetanus - utd      Hepatitis C screening - Candidate for, declines  Labs: Screening labs today. Exercise: Gets regular exercise with working in yard .  Alcohol use: none Smoking/tobacco use: Nonsmoker.  Wears seat belt: Yes. Follows annually with dermatology for scc.   HISTORY:  Past Medical History:  Diagnosis Date  . Anxiety   . Squamous cell skin cancer 2013    Past Surgical History:  Procedure Laterality Date  . CHOLECYSTECTOMY     [place]  . VAGINAL DELIVERY     3  . WISDOM TOOTH EXTRACTION     Family History  Problem Relation Age of Onset  . Thyroid disease Mother   . Diabetes Father   . Breast cancer Neg Hx   . Colon cancer Neg Hx       ALLERGIES: Patient has no known allergies.  Current Outpatient Medications on File Prior to Visit  Medication Sig Dispense Refill  . buPROPion (WELLBUTRIN XL) 300 MG 24 hr tablet TAKE 1 TABLET BY MOUTH EVERY DAY 30 tablet 5   No current facility-administered medications on file prior to visit.    Social History   Tobacco Use  . Smoking status: Never Smoker  . Smokeless tobacco: Never Used  Vaping Use  . Vaping Use: Never used  Substance Use Topics  . Alcohol use: No  . Drug  use: No    Review of Systems  Constitutional: Negative for chills, fever and unexpected weight change.  HENT: Negative for congestion.   Respiratory: Negative for cough.   Cardiovascular: Negative for chest pain, palpitations and leg swelling.  Gastrointestinal: Negative for nausea and vomiting.  Musculoskeletal: Negative for arthralgias and myalgias.  Skin: Negative for rash.  Neurological: Negative for headaches.  Hematological: Negative for adenopathy.  Psychiatric/Behavioral: Negative for confusion. The patient is not nervous/anxious.       Objective:    BP (!) 142/84   Pulse 84   Temp 97.9 F (36.6 C)   Ht 5' 4.5" (1.638 m)   Wt 211 lb 9.6 oz (96 kg)   SpO2 99%   BMI 35.76 kg/m   BP Readings from Last 3 Encounters:  08/08/20 (!) 142/84  04/30/20 (!) 148/91  03/18/20 140/90   Wt Readings from Last 3 Encounters:  08/08/20 211 lb 9.6 oz (96 kg)  04/30/20 214 lb (97.1 kg)  03/18/20 213 lb 9.6 oz (96.9 kg)    Physical Exam Vitals reviewed.  Constitutional:      Appearance: She is well-developed.  Eyes:     Conjunctiva/sclera: Conjunctivae normal.  Neck:     Thyroid: No thyroid mass or thyromegaly.  Cardiovascular:     Rate and  Rhythm: Normal rate and regular rhythm.     Pulses: Normal pulses.     Heart sounds: Normal heart sounds.  Pulmonary:     Effort: Pulmonary effort is normal.     Breath sounds: Normal breath sounds. No wheezing, rhonchi or rales.  Chest:     Breasts: Breasts are symmetrical.        Right: No inverted nipple, mass, nipple discharge, skin change or tenderness.        Left: No inverted nipple, mass, nipple discharge, skin change or tenderness.  Genitourinary:    Cervix: No cervical motion tenderness, discharge or friability.     Uterus: Not enlarged, not fixed and not tender.      Adnexa:        Right: No mass, tenderness or fullness.         Left: No mass, tenderness or fullness.       Comments: Pap performed. No CMT. Unable to  appreciated ovaries. Lymphadenopathy:     Head:     Right side of head: No submental, submandibular, tonsillar, preauricular, posterior auricular or occipital adenopathy.     Left side of head: No submental, submandibular, tonsillar, preauricular, posterior auricular or occipital adenopathy.     Cervical:     Right cervical: No superficial, deep or posterior cervical adenopathy.    Left cervical: No superficial, deep or posterior cervical adenopathy.     Upper Body:     Right upper body: No pectoral adenopathy.     Left upper body: No pectoral adenopathy.  Skin:    General: Skin is warm and dry.  Neurological:     Mental Status: She is alert.  Psychiatric:        Speech: Speech normal.        Behavior: Behavior normal.        Thought Content: Thought content normal.        Assessment & Plan:   Problem List Items Addressed This Visit      Cardiovascular and Mediastinum   HTN (hypertension)    Elevated. Start amlodipine.       Relevant Medications   amLODipine (NORVASC) 2.5 MG tablet     Other   Routine general medical examination at a health care facility - Primary    CBE and pap smear performed today. Referral for colonoscopy.       Relevant Medications   amLODipine (NORVASC) 2.5 MG tablet   Other Relevant Orders   Cytology - PAP   CBC with Differential/Platelet   Comprehensive metabolic panel   Hemoglobin A1c   HIV Antibody (routine testing w rflx)   Lipid panel   VITAMIN D 25 Hydroxy (Vit-D Deficiency, Fractures)   Ambulatory referral to Gastroenterology       I have discontinued Quinn Axe. Demers's hydrocortisone, hydrocortisone, clotrimazole, fluconazole, mebendazole, and amoxicillin. I am also having her start on amLODipine. Additionally, I am having her maintain her buPROPion.   Meds ordered this encounter  Medications  . amLODipine (NORVASC) 2.5 MG tablet    Sig: Take 1 tablet (2.5 mg total) by mouth daily.    Dispense:  90 tablet    Refill:  3     Order Specific Question:   Supervising Provider    Answer:   Crecencio Mc [[id number]]    Return precautions given.   Risks, benefits, and alternatives of the medications and treatment plan prescribed today were discussed, and patient expressed understanding.   Education regarding symptom management and diagnosis given  to patient on AVS.   Continue to follow with Burnard Hawthorne, FNP for routine health maintenance.   Jannette Fogo and I agreed with plan.   Mable Paris, FNP

## 2020-08-12 LAB — CYTOLOGY - PAP
Comment: NEGATIVE
Diagnosis: NEGATIVE
High risk HPV: NEGATIVE

## 2020-08-19 ENCOUNTER — Encounter: Payer: Self-pay | Admitting: Family

## 2020-08-20 ENCOUNTER — Encounter: Payer: Self-pay | Admitting: *Deleted

## 2020-08-20 ENCOUNTER — Other Ambulatory Visit: Payer: Self-pay | Admitting: Family

## 2020-08-20 DIAGNOSIS — Z0000 Encounter for general adult medical examination without abnormal findings: Secondary | ICD-10-CM

## 2020-08-20 DIAGNOSIS — I10 Essential (primary) hypertension: Secondary | ICD-10-CM

## 2020-08-20 MED ORDER — AMLODIPINE BESYLATE 5 MG PO TABS: 5.0000 mg | ORAL_TABLET | Freq: Every day | ORAL | 1 refills | Status: DC

## 2020-08-25 ENCOUNTER — Telehealth: Payer: Self-pay | Admitting: Family

## 2020-08-25 NOTE — Telephone Encounter (Signed)
Rejection Reason - Patient did not respondundefined" [facility] said about 8 hours ago  Mailed "unable to contact" letter to home and referring provider.

## 2020-08-27 ENCOUNTER — Other Ambulatory Visit: Payer: Self-pay | Admitting: Family

## 2020-09-08 ENCOUNTER — Encounter: Payer: Self-pay | Admitting: Emergency Medicine

## 2020-09-08 ENCOUNTER — Other Ambulatory Visit: Payer: Self-pay

## 2020-09-08 ENCOUNTER — Ambulatory Visit
Admission: EM | Admit: 2020-09-08 | Discharge: 2020-09-08 | Disposition: A | Discharge status: Home / Self Care | Payer: 59 | Attending: Internal Medicine | Admitting: Internal Medicine

## 2020-09-08 DIAGNOSIS — L02818 Cutaneous abscess of other sites: Secondary | ICD-10-CM | POA: Diagnosis present

## 2020-09-08 HISTORY — DX: Essential (primary) hypertension: I10

## 2020-09-08 MED ORDER — DOXYCYCLINE HYCLATE 100 MG PO CAPS: 100.0000 mg | ORAL_CAPSULE | Freq: Two times a day (BID) | ORAL | 0 refills | Status: DC

## 2020-09-08 MED ORDER — MUPIROCIN CALCIUM 2 % NA OINT: TOPICAL_OINTMENT | NASAL | 2 refills | Status: DC

## 2020-09-08 NOTE — ED Provider Notes (Signed)
[facility]    CSN: [id number] Arrival date & time: 09/08/20  1000      History   Chief Complaint Chief Complaint  Patient presents with  . Recurrent Skin Infections    left underarm    HPI Kathleen Hull is a 54 y.o. female who presents with recurrent abscess on l axilla xb 5 days. Has been applying heat.  This is the 7th one since July this year. Has never drained or got this large like this one. This am is when this one started draining. Only was seen once at [facility] and was placed on antibiotics. The other times she used heat and they resolved. She has one in her nose that she has been using neosporin since the past 10 days.     Past Medical History:  Diagnosis Date  . Anxiety   . Hypertension   . Squamous cell skin cancer 2013    Patient Active Problem List   Diagnosis Date Noted  . HTN (hypertension) 08/08/2020  . Vitamin D deficiency 07/14/2018  . Rash 07/14/2018  . Thyroid disorder 08/05/2015  . Pain in the chest 08/05/2015  . Obesity (BMI 30-39.9) 07/07/2015  . Menopausal symptoms 07/07/2015  . Routine general medical examination at a health care facility 09/11/2013  . Squamous cell carcinoma, trunk 09/11/2013  . Urge incontinence 09/11/2013  . Screening for breast cancer 02/22/2013    Past Surgical History:  Procedure Laterality Date  . CHOLECYSTECTOMY     [place]  . VAGINAL DELIVERY     3  . WISDOM TOOTH EXTRACTION      OB History   No obstetric history on file.      Home Medications    Prior to Admission medications   Medication Sig Start Date End Date Taking? Authorizing Provider  amLODipine (NORVASC) 5 MG tablet Take 1 tablet (5 mg total) by mouth daily. 08/20/20  Yes Burnard Hawthorne, FNP  buPROPion (WELLBUTRIN XL) 300 MG 24 hr tablet TAKE 1 TABLET BY MOUTH EVERY DAY 08/27/20  Yes Arnett, Yvetta Coder, FNP    Family History Family History  Problem Relation Age of Onset  . Thyroid disease Mother   . Hypertension  Mother   . Diabetes Father   . Hypertension Father   . Breast cancer Neg Hx   . Colon cancer Neg Hx     Social History Social History   Tobacco Use  . Smoking status: Never Smoker  . Smokeless tobacco: Never Used  Vaping Use  . Vaping Use: Never used  Substance Use Topics  . Alcohol use: No  . Drug use: No     Allergies   Patient has no known allergies.   Review of Systems Review of Systems  Constitutional: Negative for chills and fever.       Is recoved from having covid 2 weeks ago  Musculoskeletal: Negative for myalgias.  Skin: Positive for wound.  Hematological: Negative for adenopathy.     Physical Exam Triage Vital Signs ED Triage Vitals  Enc Vitals Group     BP 09/08/20 1110 139/80     Pulse Rate 09/08/20 1110 79     Resp 09/08/20 1110 18     Temp 09/08/20 1110 97.9 F (36.6 C)     Temp Source 09/08/20 1110 Oral     SpO2 09/08/20 1110 99 %     Weight 09/08/20 1110 210 lb (95.3 kg)     Height 09/08/20 1110 5\' 4"  (1.626 m)  Head Circumference --      Peak Flow --      Pain Score 09/08/20 1109 6     Pain Loc --      Pain Edu? --      Excl. in [place]? --    No data found.  Updated Vital Signs BP 139/80 (BP Location: Left Arm)   Pulse 79   Temp 97.9 F (36.6 C) (Oral)   Resp 18   Ht 5\' 4"  (1.626 m)   Wt 210 lb (95.3 kg)   LMP 09/09/2015 (Approximate)   SpO2 99%   BMI 36.05 kg/m   Visual Acuity Right Eye Distance:   Left Eye Distance:   Bilateral Distance:    Right Eye Near:   Left Eye Near:    Bilateral Near:     Physical Exam Vitals and nursing note reviewed.  Constitutional:      General: She is not in acute distress.    Appearance: She is obese. She is not toxic-appearing.  HENT:     Right Ear: External ear normal.     Left Ear: External ear normal.  Eyes:     General: No scleral icterus.    Conjunctiva/sclera: Conjunctivae normal.  Pulmonary:     Effort: Pulmonary effort is normal.  Musculoskeletal:        General:  Normal range of motion.  Skin:    General: Skin is warm and dry.     Comments: L AXILLA- with  Raised erythematous abscess of about 4x 2.5 cm which is draining puss. I obtained a culture.   Neurological:     Mental Status: She is alert and oriented to person, place, and time.     Gait: Gait normal.  Psychiatric:        Mood and Affect: Mood normal.        Behavior: Behavior normal.        Thought Content: Thought content normal.        Judgment: Judgment normal.    UC Treatments / Results  Labs (all labs ordered are listed, but only abnormal results are displayed) Labs Reviewed - No data to display  EKG   Radiology No results found.  Procedures Procedures (including critical care time)  Medications Ordered in UC Medications - No data to display  Initial Impression / Assessment and Plan / UC Course  I have reviewed the triage vital signs and the nursing notes. Wound culture sent out I placed her on Doxy and nose bacitracin treatment as noted since she is getting so many recurrences. See instructions Pertinent labs & imaging results that were available during my care of the patient were reviewed by me and considered in my medical decision making (see chart for details).  Final Clinical Impressions(s) / UC Diagnoses   Final diagnoses:  None   Discharge Instructions   None    ED Prescriptions    None     PDMP not reviewed this encounter.   Shelby Mattocks, Hershal Coria 09/08/20 2202

## 2020-09-08 NOTE — ED Triage Notes (Signed)
Patient in today c/o a boil under her left arm x 3-4 days. Patient has been using warm compresses, neosporin and covering with bandaid. Patient states the area did drain quite a bit this morning.

## 2020-09-09 ENCOUNTER — Other Ambulatory Visit: Payer: 59

## 2020-09-10 LAB — AEROBIC CULTURE W GRAM STAIN (SUPERFICIAL SPECIMEN)
Gram Stain: NONE SEEN
Special Requests: NORMAL

## 2020-11-10 ENCOUNTER — Ambulatory Visit (INDEPENDENT_AMBULATORY_CARE_PROVIDER_SITE_OTHER): Payer: 59

## 2020-11-10 ENCOUNTER — Ambulatory Visit (INDEPENDENT_AMBULATORY_CARE_PROVIDER_SITE_OTHER): Payer: 59 | Admitting: Family

## 2020-11-10 ENCOUNTER — Encounter: Payer: Self-pay | Admitting: Family

## 2020-11-10 ENCOUNTER — Other Ambulatory Visit: Payer: Self-pay

## 2020-11-10 VITALS — BP 142/84 | HR 83 | Temp 98.6°F | Ht 64.0 in | Wt 200.2 lb

## 2020-11-10 DIAGNOSIS — N951 Menopausal and female climacteric states: Secondary | ICD-10-CM

## 2020-11-10 DIAGNOSIS — R9431 Abnormal electrocardiogram [ECG] [EKG]: Secondary | ICD-10-CM | POA: Insufficient documentation

## 2020-11-10 DIAGNOSIS — M25512 Pain in left shoulder: Secondary | ICD-10-CM | POA: Diagnosis not present

## 2020-11-10 DIAGNOSIS — I10 Essential (primary) hypertension: Secondary | ICD-10-CM

## 2020-11-10 DIAGNOSIS — Z1211 Encounter for screening for malignant neoplasm of colon: Secondary | ICD-10-CM

## 2020-11-10 MED ORDER — CYCLOBENZAPRINE HCL 5 MG PO TABS: 5.0000 mg | ORAL_TABLET | Freq: Two times a day (BID) | ORAL | 0 refills | Status: DC | PRN

## 2020-11-10 NOTE — Patient Instructions (Addendum)
Please call [facility] to schedule colonoscopy; let me know if you need a new referral. [phone]   Heat heat heat for left shoulder  Use flexeril as needed for muscle spasm   Stop ibuprofen and try tylenol to see if blood pressure comes down.   Monitor blood pressure at home and me 5-6 reading on separate days. Goal is less than 120/80, based on newest guidelines, however we certainly want to be less than 130/80;  if persistently higher, please make sooner follow up appointment so we can recheck you blood pressure and manage/ adjust medications.

## 2020-11-10 NOTE — Progress Notes (Signed)
Subjective:    Patient ID: Kathleen Hull, female    DOB: 1966-09-06, 54 y.o.   MRN: [id number]  CC: Kathleen Hull is a 54 y.o. female who presents today for follow up.   HPI: Left anterior shoulder pain x 3 weeks, slight improvement. Pain radiates across left side of chest.  Onset with lifting a dresser and pain started couple of days after.  Over the weekend, she suspects that she aggravated with lifting christmas boxes. Pain is dull when still. Pain worse when lifting pocket book. No pain with brushing hair. No pain when walking.  Tried ibuprofen 600mg   QD prn with some relief. No h/o GIB.   No palpitations, cough, sob, nausea, diaphoresis, left arm numbness or jaw pain.   HTN- started amlodipine 5mg  couple of months ago. At home, 130/90. Couple of episodes leg swelling.   Menopausal symptoms- compliant with wellbutrin. Working on eating better to loose weight.    Due colonoscopy, labs  Has never seen cardiology Father has h/o CABG   HISTORY:  Past Medical History:  Diagnosis Date  . Anxiety   . Hypertension   . Squamous cell skin cancer 2013   Past Surgical History:  Procedure Laterality Date  . CHOLECYSTECTOMY     [place]  . VAGINAL DELIVERY     3  . WISDOM TOOTH EXTRACTION     Family History  Problem Relation Age of Onset  . Thyroid disease Mother   . Hypertension Mother   . Diabetes Father   . Hypertension Father   . CAD Father   . Breast cancer Neg Hx   . Colon cancer Neg Hx     Allergies: Patient has no known allergies. Current Outpatient Medications on File Prior to Visit  Medication Sig Dispense Refill  . amLODipine (NORVASC) 5 MG tablet Take 1 tablet (5 mg total) by mouth daily. 90 tablet 1  . buPROPion (WELLBUTRIN XL) 300 MG 24 hr tablet TAKE 1 TABLET BY MOUTH EVERY DAY 30 tablet 5   No current facility-administered medications on file prior to visit.    Social History   Tobacco Use  . Smoking status: Never Smoker  . Smokeless  tobacco: Never Used  Vaping Use  . Vaping Use: Never used  Substance Use Topics  . Alcohol use: No  . Drug use: No    Review of Systems  Constitutional: Negative for chills, fever and unexpected weight change.  HENT: Negative for congestion.   Respiratory: Negative for cough and shortness of breath.   Cardiovascular: Negative for chest pain, palpitations and leg swelling.  Gastrointestinal: Negative for nausea and vomiting.  Musculoskeletal: Positive for arthralgias. Negative for myalgias and neck stiffness.  Skin: Negative for rash.  Neurological: Negative for numbness and headaches.  Hematological: Negative for adenopathy.  Psychiatric/Behavioral: Negative for confusion.      Objective:    BP (!) 142/84   Pulse 83   Temp 98.6 F (37 C)   Ht 5\' 4"  (1.626 m)   Wt 200 lb 3.2 oz (90.8 kg)   LMP 09/09/2015 (Approximate)   SpO2 99%   BMI 34.36 kg/m  BP Readings from Last 3 Encounters:  11/10/20 (!) 142/84  09/08/20 139/80  08/08/20 (!) 142/84   Wt Readings from Last 3 Encounters:  11/10/20 200 lb 3.2 oz (90.8 kg)  09/08/20 210 lb (95.3 kg)  08/08/20 211 lb 9.6 oz (96 kg)    Physical Exam Vitals reviewed.  Constitutional:      Appearance:  She is well-developed.  Eyes:     Conjunctiva/sclera: Conjunctivae normal.  Cardiovascular:     Rate and Rhythm: Normal rate and regular rhythm.     Pulses: Normal pulses.     Heart sounds: Normal heart sounds.  Pulmonary:     Effort: Pulmonary effort is normal.     Breath sounds: Normal breath sounds. No wheezing, rhonchi or rales.  Chest:     Chest wall: No lacerations, tenderness or crepitus. There is no dullness to percussion.  Musculoskeletal:     Right shoulder: No swelling or bony tenderness. Normal range of motion.     Left shoulder: Tenderness present. No swelling or bony tenderness. Normal range of motion.     Comments: Left Shoulder:   No asymmetry of shoulders when comparing right and left.No pain with palpation  over glenohumeral joint lines, Perry joint. Tenderness over AC joint.No pain with internal and external rotation. Pain with resisted lateral extension .   Negative drop arm.  No pain, swelling, or ecchymosis noted over long head of biceps.   Strength and sensation normal BUE's.   Skin:    General: Skin is warm and dry.  Neurological:     Mental Status: She is alert.  Psychiatric:        Speech: Speech normal.        Behavior: Behavior normal.        Thought Content: Thought content normal.        Assessment & Plan:   Problem List Items Addressed This Visit      Cardiovascular and Mediastinum   HTN (hypertension)    Elevated today. Discussed at length with patient and suspect related to NSAID use, left shoulder pain. We agreed to stop NSAIDs and trial tylenol, flexeril to see if by controlling pain , blood pressure improves. If not we will opt to add on losartan 50mg  as she has minimal swelling on amlodipine 5mg . Continue amlodipine 5mg  for now.         Other   Abnormal EKG    Left shoulder pain doesn't correlate with acute cardiac etiology. Pain is not exertional with walking and aggravated with lifting. However EKG shows q wave , poor r wave progression which has been present since 2016. Concern for prior MI and need for ischemic workup particularly in setting of father's history.  Patient has not had cardiac consult. Pending cardiac consult.EKG reviewed with supervising, Dr Deborra Medina, and she and I jointly agreed on management plan.        Relevant Orders   Ambulatory referral to Cardiology   Left shoulder pain - Primary    Presentation most notable for [facility] joint tenderness. Question muscular strain and/or underlying osteoarthritis. Pending XR, conservative management as she has had some improvement of pain. She will let me know how she is doing      Relevant Medications   cyclobenzaprine (FLEXERIL) 5 MG tablet   Other Relevant Orders   EKG 12-Lead (Completed)   DG  Shoulder Left (Completed)   Menopausal symptoms    Stable, continue wellbutrin 300mg       Screen for colon cancer    Due. Provided phone number for patient to schedule colonoscopy as patient missed phone call from [place].           I have discontinued Kathleen Hull's doxycycline and mupirocin nasal ointment. I am also having her start on cyclobenzaprine. Additionally, I am having her maintain her amLODipine and buPROPion.   Meds  ordered this encounter  Medications  . cyclobenzaprine (FLEXERIL) 5 MG tablet    Sig: Take 1 tablet (5 mg total) by mouth 2 (two) times daily as needed for muscle spasms.    Dispense:  30 tablet    Refill:  0    Order Specific Question:   Supervising Provider    Answer:   Crecencio Mc [[id number]]    Return precautions given.   Risks, benefits, and alternatives of the medications and treatment plan prescribed today were discussed, and patient expressed understanding.   Education regarding symptom management and diagnosis given to patient on AVS.  Continue to follow with Burnard Hawthorne, FNP for routine health maintenance.   Jannette Fogo and I agreed with plan.   Mable Paris, FNP

## 2020-11-11 ENCOUNTER — Encounter: Payer: Self-pay | Admitting: Family

## 2020-11-11 DIAGNOSIS — Z1211 Encounter for screening for malignant neoplasm of colon: Secondary | ICD-10-CM | POA: Insufficient documentation

## 2020-11-11 NOTE — Assessment & Plan Note (Signed)
Due. Provided phone number for patient to schedule colonoscopy as patient missed phone call from [place].

## 2020-11-11 NOTE — Assessment & Plan Note (Addendum)
Left shoulder pain doesn't correlate with acute cardiac etiology. Pain is not exertional with walking and aggravated with lifting. However EKG shows q wave , poor r wave progression which has been present since 2016. Concern for prior MI and need for ischemic workup particularly in setting of father's history.  Patient has not had cardiac consult. Pending cardiac consult.EKG reviewed with supervising, Dr Deborra Medina, and she and I jointly agreed on management plan.

## 2020-11-11 NOTE — Assessment & Plan Note (Signed)
Stable, continue wellbutrin 300mg 

## 2020-11-11 NOTE — Assessment & Plan Note (Signed)
Presentation most notable for [facility] joint tenderness. Question muscular strain and/or underlying osteoarthritis. Pending XR, conservative management as she has had some improvement of pain. She will let me know how she is doing

## 2020-11-11 NOTE — Assessment & Plan Note (Signed)
Elevated today. Discussed at length with patient and suspect related to NSAID use, left shoulder pain. We agreed to stop NSAIDs and trial tylenol, flexeril to see if by controlling pain , blood pressure improves. If not we will opt to add on losartan 50mg  as she has minimal swelling on amlodipine 5mg . Continue amlodipine 5mg  for now.

## 2020-11-17 ENCOUNTER — Encounter: Payer: Self-pay | Admitting: Family

## 2020-11-28 ENCOUNTER — Encounter: Payer: Self-pay | Admitting: Cardiovascular Disease

## 2020-11-28 ENCOUNTER — Other Ambulatory Visit: Payer: Self-pay

## 2020-11-28 ENCOUNTER — Ambulatory Visit (INDEPENDENT_AMBULATORY_CARE_PROVIDER_SITE_OTHER): Payer: 59 | Admitting: Cardiovascular Disease

## 2020-11-28 VITALS — BP 140/92 | HR 87 | Ht 63.0 in | Wt 197.0 lb

## 2020-11-28 DIAGNOSIS — I10 Essential (primary) hypertension: Secondary | ICD-10-CM

## 2020-11-28 DIAGNOSIS — E6609 Other obesity due to excess calories: Secondary | ICD-10-CM | POA: Diagnosis not present

## 2020-11-28 DIAGNOSIS — R9431 Abnormal electrocardiogram [ECG] [EKG]: Secondary | ICD-10-CM | POA: Diagnosis not present

## 2020-11-28 DIAGNOSIS — R0789 Other chest pain: Secondary | ICD-10-CM | POA: Diagnosis not present

## 2020-11-28 NOTE — Patient Instructions (Signed)
Medication Instructions:  No changes  If you need a refill on your cardiac medications before your next appointment, please call your pharmacy.    Lab work: No new labs needed   If you have labs (blood work) drawn today and your tests are completely normal, you will receive your results only by: . MyChart Message (if you have MyChart) OR . A paper copy in the mail If you have any lab test that is abnormal or we need to change your treatment, we will call you to review the results.   Testing/Procedures: No new testing needed   Follow-Up: At [facility], you and your health needs are our priority.  As part of our continuing mission to provide you with exceptional heart care, we have created designated Provider Care Teams.  These Care Teams include your primary Cardiologist (physician) and Advanced Practice Providers (APPs -  Physician Assistants and Nurse Practitioners) who all work together to provide you with the care you need, when you need it.  . You will need a follow up appointment as needed  . Providers on your designated Care Team:   . Christopher Berge, NP . Ryan Dunn, PA-C . Jacquelyn Visser, PA-C  Any Other Special Instructions Will Be Listed Below (If Applicable).  COVID-19 Vaccine Information can be found at: [email or web address] For questions related to vaccine distribution or appointments, please email [email or web address] or call [phone].     

## 2020-11-28 NOTE — Progress Notes (Signed)
Cardiology Office Note  Date:  11/28/2020   ID:  Kathleen Hull, DOB 07-17-1966, MRN [id number]  PCP:  Kathleen Hawthorne, FNP   Chief Complaint  Patient presents with   office visit per Kathleen Paris, NP-abnormal EKG    HPI:  [name] is a 54 year old woman with past medical history of Hypertension Nonsmoker No diabetes Who presents by referral from Kathleen Hull for consultation of her chest pain, abnormal EKG  Active, teacher for preschool/elementary school 1 month ago reported using the head tremor for 45 minutes Arms outstretched, Following that she developed left upper chest pain tender on moving her arm, breathing in, palpation Has been sore since that time with slow improvement  When seen by primary care 2 weeks later, symptoms were mild Have been taking ibuprofen  EKG done at that time with poor R wave progression to the anterior precordial leads  EKG personally reviewed by myself on todays visit Normal sinus rhythm with rate 87 bpm poor R wave progression, no significant ST-T wave changes  Reports that she is watching her diet Weight down 15 pounds  Father with CAD, CABG Mother fine, no significant cardiac issues   PMH:   has a past medical history of Anxiety, Hypertension, and Squamous cell skin cancer (2013).  PSH:    Past Surgical History:  Procedure Laterality Date   CHOLECYSTECTOMY     [place]   VAGINAL DELIVERY     3   WISDOM TOOTH EXTRACTION      Current Outpatient Medications  Medication Sig Dispense Refill   amLODipine (NORVASC) 5 MG tablet Take 1 tablet (5 mg total) by mouth daily. 90 tablet 1   buPROPion (WELLBUTRIN XL) 300 MG 24 hr tablet TAKE 1 TABLET BY MOUTH EVERY DAY 30 tablet 5   cyclobenzaprine (FLEXERIL) 5 MG tablet Take 1 tablet (5 mg total) by mouth 2 (two) times daily as needed for muscle spasms. 30 tablet 0   No current facility-administered medications for this visit.     Allergies:   Patient has no  known allergies.   Social History:  The patient  reports that she has never smoked. She has never used smokeless tobacco. She reports that she does not drink alcohol and does not use drugs.   Family History:   family history includes CAD in her father; Diabetes in her father; Hypertension in her father and mother; Thyroid disease in her mother.    Review of Systems: Review of Systems  Constitutional: Negative.   HENT: Negative.   Respiratory: Negative.   Cardiovascular: Negative.   Gastrointestinal: Negative.   Musculoskeletal: Negative.        Left chest on palpation  Neurological: Negative.   Psychiatric/Behavioral: Negative.   All other systems reviewed and are negative.   PHYSICAL EXAM: VS:  BP (!) 140/92 (BP Location: Left Arm, Patient Position: Sitting, Cuff Size: Large)    Pulse 87    Ht 5\' 3"  (1.6 m)    Wt 197 lb (89.4 kg)    LMP 09/09/2015 (Approximate)    BMI 34.90 kg/m  , BMI Body mass index is 34.9 kg/m. GEN: Well nourished, well developed, in no acute distress HEENT: normal Neck: no JVD, carotid bruits, or masses Cardiac: RRR; no murmurs, rubs, or gallops,no edema  Respiratory:  clear to auscultation bilaterally, normal work of breathing GI: soft, nontender, nondistended, + BS MS: no deformity or atrophy Skin: warm and dry, no rash Neuro:  Strength and sensation are intact Psych: euthymic  mood, full affect   Recent Labs: No results found for requested labs within last 8760 hours.    Lipid Panel Lab Results  Component Value Date   CHOL 140 06/14/2017   HDL 39.20 06/14/2017   LDLCALC 78 06/14/2017   TRIG 118.0 06/14/2017      Wt Readings from Last 3 Encounters:  11/28/20 197 lb (89.4 kg)  11/10/20 200 lb 3.2 oz (90.8 kg)  09/08/20 210 lb (95.3 kg)     ASSESSMENT AND PLAN:  Problem List Items Addressed This Visit      Cardiology Problems   HTN (hypertension)     Other   Abnormal EKG    Other Visit Diagnoses    Atypical chest pain    -   Primary   Class 2 obesity due to excess calories without serious comorbidity in adult, unspecified BMI         Musculoskeletal chest pain on the left Slowly improving with rest Consider ice/heat, ibuprofen/NSAIDs Could consider seeing chiropractic given some radiation to left scapular area, some point tenderness/paravertebral muscle spasms  Essential hypertension Numbers from home, quite reasonable 1 03-5 30 systolic even before her amlodipine when she checks in the stressful morning prior to going to school Recommend she check her blood pressure after taking the medication as well when she is less stressed such as on the weekend Numbers should continue to improve with recent weight loss  Obesity Complemented her on recent weight loss She is walking more, watching her diet  Cardiovascular risk stratification Non-smoker, no diabetes, cholesterol relatively well controlled by numbers 2 3 years ago Discussed CT coronary calcium scoring if she would like further risk stratification  information provided  Abnormal EKG Poor R wave progression, Likely lead placement issues , in particular placement of V2 and V3 in the setting of breast tissue on the left No strong indication for ischemic work-up at this time   Total encounter time more than 60 minutes  Greater than 50% was spent in counseling and coordination of care with the patient  Patient seen in consultation for Kathleen Hull and will be referred back to her office for ongoing care of the issues detailed above  Signed, Esmond Plants, M.D., Ph.D. [place], [place]

## 2020-12-04 ENCOUNTER — Other Ambulatory Visit: Payer: Self-pay

## 2020-12-04 ENCOUNTER — Other Ambulatory Visit (INDEPENDENT_AMBULATORY_CARE_PROVIDER_SITE_OTHER): Payer: 59

## 2020-12-04 DIAGNOSIS — Z0000 Encounter for general adult medical examination without abnormal findings: Secondary | ICD-10-CM

## 2020-12-04 LAB — CBC WITH DIFFERENTIAL/PLATELET
Basophils Absolute: 0 10*3/uL (ref 0.0–0.1)
Basophils Relative: 0.7 % (ref 0.0–3.0)
Eosinophils Absolute: 0.1 10*3/uL (ref 0.0–0.7)
Eosinophils Relative: 2 % (ref 0.0–5.0)
HCT: 37.2 % (ref 36.0–46.0)
Hemoglobin: 12.9 g/dL (ref 12.0–15.0)
Lymphocytes Relative: 32.9 % (ref 12.0–46.0)
Lymphs Abs: 2.2 10*3/uL (ref 0.7–4.0)
MCHC: 34.7 g/dL (ref 30.0–36.0)
MCV: 94.9 fl (ref 78.0–100.0)
Monocytes Absolute: 0.3 10*3/uL (ref 0.1–1.0)
Monocytes Relative: 4.9 % (ref 3.0–12.0)
Neutro Abs: 4 10*3/uL (ref 1.4–7.7)
Neutrophils Relative %: 59.5 % (ref 43.0–77.0)
Platelets: 307 10*3/uL (ref 150.0–400.0)
RBC: 3.92 Mil/uL (ref 3.87–5.11)
RDW: 13 % (ref 11.5–15.5)
WBC: 6.7 10*3/uL (ref 4.0–10.5)

## 2020-12-04 LAB — COMPREHENSIVE METABOLIC PANEL
ALT: 15 U/L (ref 0–35)
AST: 14 U/L (ref 0–37)
Albumin: 4.4 g/dL (ref 3.5–5.2)
Alkaline Phosphatase: 82 U/L (ref 39–117)
BUN: 16 mg/dL (ref 6–23)
CO2: 29 mEq/L (ref 19–32)
Calcium: 9.3 mg/dL (ref 8.4–10.5)
Chloride: 104 mEq/L (ref 96–112)
Creatinine, Ser: 0.91 mg/dL (ref 0.40–1.20)
GFR: 72.77 mL/min (ref >60.00)
Glucose, Bld: 86 mg/dL (ref 70–99)
Potassium: 3.9 mEq/L (ref 3.5–5.1)
Sodium: 140 mEq/L (ref 135–145)
Total Bilirubin: 0.5 mg/dL (ref 0.2–1.2)
Total Protein: 6.9 g/dL (ref 6.0–8.3)

## 2020-12-04 LAB — LIPID PANEL
Cholesterol: 163 mg/dL (ref 0–200)
HDL: 48.2 mg/dL (ref >39.00)
LDL Cholesterol: 95 mg/dL (ref 0–99)
NonHDL: 114.88
Total CHOL/HDL Ratio: 3
Triglycerides: 101 mg/dL (ref 0.0–149.0)
VLDL: 20.2 mg/dL (ref 0.0–40.0)

## 2020-12-04 LAB — HEMOGLOBIN A1C: Hgb A1c MFr Bld: 4.7 % (ref 4.6–6.5)

## 2020-12-04 LAB — VITAMIN D 25 HYDROXY (VIT D DEFICIENCY, FRACTURES): VITD: 45.57 ng/mL (ref 30.00–100.00)

## 2020-12-05 LAB — HIV ANTIBODY (ROUTINE TESTING W REFLEX): HIV 1&2 Ab, 4th Generation: NONREACTIVE

## 2020-12-29 ENCOUNTER — Ambulatory Visit: Payer: 59 | Admitting: Family

## 2021-01-21 ENCOUNTER — Encounter: Payer: Self-pay | Admitting: Family

## 2021-01-21 ENCOUNTER — Other Ambulatory Visit: Payer: Self-pay

## 2021-01-21 ENCOUNTER — Ambulatory Visit (INDEPENDENT_AMBULATORY_CARE_PROVIDER_SITE_OTHER): Payer: 59 | Admitting: Family

## 2021-01-21 VITALS — BP 128/78 | HR 76 | Temp 98.4°F | Resp 16 | Ht 64.0 in | Wt 190.6 lb

## 2021-01-21 DIAGNOSIS — E669 Obesity, unspecified: Secondary | ICD-10-CM

## 2021-01-21 DIAGNOSIS — M25512 Pain in left shoulder: Secondary | ICD-10-CM

## 2021-01-21 DIAGNOSIS — I10 Essential (primary) hypertension: Secondary | ICD-10-CM | POA: Diagnosis not present

## 2021-01-21 MED ORDER — WEGOVY 0.25 MG/0.5ML ~~LOC~~ SOAJ: 0.2500 mg | SUBCUTANEOUS | 2 refills | Status: DC

## 2021-01-21 NOTE — Assessment & Plan Note (Signed)
Near resolution. She declines further evaluation.

## 2021-01-21 NOTE — Assessment & Plan Note (Signed)
Controlled. Continue amlodipine 5mg 

## 2021-01-21 NOTE — Assessment & Plan Note (Signed)
Discussed at length [facility] as well as black box warning regarding thyroid cancer. She would like to read through material I have provided regarding medication and decide if she would like to start. She will let me know if she fills prescription.

## 2021-01-21 NOTE — Patient Instructions (Signed)
We have discussed starting non insulin daily injectable medication called Wegovy  which is a glucagon like peptide (GLP 1) agonist and works by delaying gastric emptying and increasing insulin secretion.It is given once per week. Most patients see significant weight loss with this drug class.   You may NOT take either medication if you or your family has history of thyroid, parathyroid, OR adrenal cancer. Please confirm you and your family does NOT have this history as this drug class has black box warning on this medication for that reason.   Advise to follow with directions on prescription and slowly increase from 0.25mg [place] once per week ;stay here for 4 weeks. You may then increase to 0.5mg [place] once per week and stay there for 4 weeks.  We can slowly titrate further at follow up with goal of no more than 1-2 lbs weight loss per week.  

## 2021-01-21 NOTE — Progress Notes (Signed)
Subjective:    Patient ID: Kathleen Hull, female    DOB: 20-Nov-1966, 56 y.o.   MRN: [id number]  CC: Kathleen Hull is a 55 y.o. female who presents today for follow up.   HPI:  Has been working on weight loss, loss about 10 lbs. Drinking more water, eating at home, healthier. She would like to discuss weight loss medication.   Left shoulder pain has improved greatly  No personal or family h/o thyroid cancer  Colonoscopy and she had been called from GI; she will call them back.   HTN- compliant with amlodipine 5mg  QD. No cp.      Consulted with Dr Kathleen Hull 35months ago regarding abnormal EKG.  No ischemic work up at this time   No HISTORY:  Past Medical History:  Diagnosis Date  . Anxiety   . Hypertension   . Squamous cell skin cancer 2013   Past Surgical History:  Procedure Laterality Date  . CHOLECYSTECTOMY     [place]  . VAGINAL DELIVERY     3  . WISDOM TOOTH EXTRACTION     Family History  Problem Relation Age of Onset  . Thyroid disease Mother   . Hypertension Mother   . Diabetes Father   . Hypertension Father   . CAD Father   . Breast cancer Neg Hx   . Colon cancer Neg Hx   . Thyroid cancer Neg Hx     Allergies: Patient has no known allergies. Current Outpatient Medications on File Prior to Visit  Medication Sig Dispense Refill  . amLODipine (NORVASC) 5 MG tablet Take 1 tablet (5 mg total) by mouth daily. 90 tablet 1  . buPROPion (WELLBUTRIN XL) 300 MG 24 hr tablet TAKE 1 TABLET BY MOUTH EVERY DAY 30 tablet 5  . cyclobenzaprine (FLEXERIL) 5 MG tablet Take 1 tablet (5 mg total) by mouth 2 (two) times daily as needed for muscle spasms. 30 tablet 0   No current facility-administered medications on file prior to visit.    Social History   Tobacco Use  . Smoking status: Never Smoker  . Smokeless tobacco: Never Used  Vaping Use  . Vaping Use: Never used  Substance Use Topics  . Alcohol use: No  . Drug use: No    Review of Systems   Constitutional: Negative for chills and fever.  Respiratory: Negative for cough.   Cardiovascular: Negative for chest pain and palpitations.  Gastrointestinal: Negative for nausea and vomiting.  Musculoskeletal: Negative for arthralgias (resolved. ).      Objective:    BP 128/78   Pulse 76   Temp 98.4 F (36.9 C) (Oral)   Resp 16   Ht 5\' 4"  (1.626 m)   Wt 190 lb 9.6 oz (86.5 kg)   LMP 09/09/2015 (Approximate)   SpO2 99%   BMI 32.72 kg/m  BP Readings from Last 3 Encounters:  01/21/21 128/78  11/28/20 (!) 140/92  11/10/20 (!) 142/84   Wt Readings from Last 3 Encounters:  01/21/21 190 lb 9.6 oz (86.5 kg)  11/28/20 197 lb (89.4 kg)  11/10/20 200 lb 3.2 oz (90.8 kg)    Physical Exam Vitals reviewed.  Constitutional:      Appearance: She is well-developed and well-nourished.  Eyes:     Conjunctiva/sclera: Conjunctivae normal.  Neck:     Thyroid: No thyroid mass or thyromegaly.  Cardiovascular:     Rate and Rhythm: Normal rate and regular rhythm.     Pulses: Normal pulses.  Heart sounds: Normal heart sounds.  Pulmonary:     Effort: Pulmonary effort is normal.     Breath sounds: Normal breath sounds. No wheezing, rhonchi or rales.  Lymphadenopathy:     Head:     Right side of head: No submental, submandibular, tonsillar, preauricular, posterior auricular or occipital adenopathy.     Left side of head: No submental, submandibular, tonsillar, preauricular, posterior auricular or occipital adenopathy.     Cervical: No cervical adenopathy.  Skin:    General: Skin is warm and dry.  Neurological:     Mental Status: She is alert.  Psychiatric:        Mood and Affect: Mood and affect normal.        Speech: Speech normal.        Behavior: Behavior normal.        Thought Content: Thought content normal.        Assessment & Plan:   Problem List Items Addressed This Visit      Cardiovascular and Mediastinum   HTN (hypertension)    Controlled. Continue amlodipine  5mg         Other   Left shoulder pain    Near resolution. She declines further evaluation.       Obesity (BMI 30.0-34.9) - Primary    Discussed at length [facility] as well as black box warning regarding thyroid cancer. She would like to read through material I have provided regarding medication and decide if she would like to start. She will let me know if she fills prescription.      Relevant Medications   Semaglutide-Weight Management (WEGOVY) 0.25 MG/0.5ML SOAJ       I am having Kathleen Hull start on [place]. I am also having her maintain her amLODipine, buPROPion, and cyclobenzaprine.   Meds ordered this encounter  Medications  . Semaglutide-Weight Management (WEGOVY) 0.25 MG/0.5ML SOAJ    Sig: Inject 0.25 mg into the skin once a week.    Dispense:  2 mL    Refill:  2    Order Specific Question:   Supervising Provider    Answer:   Kathleen Hull [[id number]]    Return precautions given.   Risks, benefits, and alternatives of the medications and treatment plan prescribed today were discussed, and patient expressed understanding.   Education regarding symptom management and diagnosis given to patient on AVS.  Continue to follow with Kathleen Hawthorne, FNP for routine health maintenance.   Kathleen Hull and I agreed with plan.   Kathleen Paris, FNP

## 2021-01-27 ENCOUNTER — Encounter: Payer: Self-pay | Admitting: Family

## 2021-02-06 ENCOUNTER — Other Ambulatory Visit: Payer: Self-pay | Admitting: Family

## 2021-02-06 MED ORDER — OZEMPIC (0.25 OR 0.5 MG/DOSE) 2 MG/1.5ML ~~LOC~~ SOPN: 0.2500 mg | PEN_INJECTOR | SUBCUTANEOUS | 3 refills | Status: DC

## 2021-02-11 ENCOUNTER — Other Ambulatory Visit: Payer: Self-pay | Admitting: Family

## 2021-02-11 ENCOUNTER — Other Ambulatory Visit: Payer: Self-pay

## 2021-02-11 DIAGNOSIS — E669 Obesity, unspecified: Secondary | ICD-10-CM

## 2021-02-11 MED ORDER — METFORMIN HCL ER 500 MG PO TB24: ORAL_TABLET | ORAL | 3 refills | Status: DC

## 2021-02-11 NOTE — Telephone Encounter (Signed)
Please see fax from [facility] in your folder. Pt does not have type II diabetes & has not tried/failed metformin.

## 2021-02-14 ENCOUNTER — Other Ambulatory Visit: Payer: Self-pay | Admitting: Family

## 2021-02-14 DIAGNOSIS — Z0000 Encounter for general adult medical examination without abnormal findings: Secondary | ICD-10-CM

## 2021-02-14 DIAGNOSIS — I10 Essential (primary) hypertension: Secondary | ICD-10-CM

## 2021-02-16 ENCOUNTER — Other Ambulatory Visit: Payer: Self-pay

## 2021-02-16 MED ORDER — BUPROPION HCL ER (XL) 300 MG PO TB24: 300.0000 mg | ORAL_TABLET | Freq: Every day | ORAL | 3 refills | Status: DC

## 2021-02-18 ENCOUNTER — Telehealth: Payer: Self-pay | Admitting: Family

## 2021-02-18 NOTE — Telephone Encounter (Signed)
PA no longer needed due to medication being changed.

## 2021-02-18 NOTE — Telephone Encounter (Signed)
[employer] called about patient's prior authorization ref key [id number]  [phone]

## 2021-03-11 ENCOUNTER — Encounter: Payer: Self-pay | Admitting: Dermatology

## 2021-03-11 ENCOUNTER — Other Ambulatory Visit: Payer: Self-pay

## 2021-03-11 ENCOUNTER — Ambulatory Visit (INDEPENDENT_AMBULATORY_CARE_PROVIDER_SITE_OTHER): Payer: 59 | Admitting: Dermatology

## 2021-03-11 DIAGNOSIS — Z1283 Encounter for screening for malignant neoplasm of skin: Secondary | ICD-10-CM | POA: Diagnosis not present

## 2021-03-11 DIAGNOSIS — D1801 Hemangioma of skin and subcutaneous tissue: Secondary | ICD-10-CM | POA: Diagnosis not present

## 2021-03-11 DIAGNOSIS — D1800 Hemangioma unspecified site: Secondary | ICD-10-CM

## 2021-03-11 DIAGNOSIS — L814 Other melanin hyperpigmentation: Secondary | ICD-10-CM

## 2021-03-11 DIAGNOSIS — L578 Other skin changes due to chronic exposure to nonionizing radiation: Secondary | ICD-10-CM

## 2021-03-11 DIAGNOSIS — L821 Other seborrheic keratosis: Secondary | ICD-10-CM

## 2021-03-11 DIAGNOSIS — Z85828 Personal history of other malignant neoplasm of skin: Secondary | ICD-10-CM | POA: Diagnosis not present

## 2021-03-11 DIAGNOSIS — D229 Melanocytic nevi, unspecified: Secondary | ICD-10-CM

## 2021-03-11 NOTE — Patient Instructions (Addendum)

## 2021-03-11 NOTE — Progress Notes (Signed)
   Follow-Up Visit   Subjective  Kathleen Hull is a 54 y.o. female who presents for the following: Annual Exam (Mole check ). Hx of BCC on her chest removed by her previous dermatologist several years ago.  The patient presents for Total-Body Skin Exam (TBSE) for skin cancer screening and mole check.  The following portions of the chart were reviewed this encounter and updated as appropriate:   Tobacco  Allergies  Meds  Problems  Med Hx  Surg Hx  Fam Hx     Review of Systems:  No other skin or systemic complaints except as noted in HPI or Assessment and Plan.  Objective  Well appearing patient in no apparent distress; mood and affect are within normal limits.  A full examination was performed including scalp, head, eyes, ears, nose, lips, neck, chest, axillae, abdomen, back, buttocks, bilateral upper extremities, bilateral lower extremities, hands, feet, fingers, toes, fingernails, and toenails. All findings within normal limits unless otherwise noted below.  Objective  Left medial knee: Purple macule    Assessment & Plan  Hemangioma of skin Left medial knee Benign-appearing.  Observation.  Call clinic for new or changing moles.  Recommend daily use of broad spectrum spf 30+ sunscreen to sun-exposed areas.    Skin cancer screening   Lentigines - Scattered tan macules - Due to sun exposure - Benign-appering, observe - Recommend daily broad spectrum sunscreen SPF 30+ to sun-exposed areas, reapply every 2 hours as needed. - Call for any changes  Seborrheic Keratoses - Stuck-on, waxy, tan-brown papules and/or plaques  - Benign-appearing - Discussed benign etiology and prognosis. - Observe - Call for any changes  Melanocytic Nevi - Tan-brown and/or pink-flesh-colored symmetric macules and papules - Benign appearing on exam today - Observation - Call clinic for new or changing moles - Recommend daily use of broad spectrum spf 30+ sunscreen to sun-exposed areas.    Actinic Damage - Chronic condition, secondary to cumulative UV/sun exposure - diffuse scaly erythematous macules with underlying dyspigmentation - Recommend daily broad spectrum sunscreen SPF 30+ to sun-exposed areas, reapply every 2 hours as needed.  - Staying in the shade or wearing long sleeves, sun glasses (UVA+UVB protection) and wide brim hats (4-inch brim around the entire circumference of the hat) are also recommended for sun protection.  - Call for new or changing lesions.  History of Basal Cell Carcinoma of the Skin Chest  - No evidence of recurrence today - Recommend regular full body skin exams - Recommend daily broad spectrum sunscreen SPF 30+ to sun-exposed areas, reapply every 2 hours as needed.  - Call if any new or changing lesions are noted between office visits  Skin cancer screening performed today.  Return in about 1 year (around 03/11/2022) for TBSE.  IMarye Round, CMA, am acting as scribe for Sarina Ser, MD .  Documentation: I have reviewed the above documentation for accuracy and completeness, and I agree with the above.  Sarina Ser, MD

## 2021-03-12 ENCOUNTER — Encounter: Payer: Self-pay | Admitting: Dermatology

## 2021-04-20 ENCOUNTER — Ambulatory Visit: Payer: 59 | Admitting: Family

## 2021-05-25 ENCOUNTER — Encounter: Payer: Self-pay | Admitting: Family

## 2021-05-26 ENCOUNTER — Other Ambulatory Visit: Payer: Self-pay

## 2021-05-28 ENCOUNTER — Encounter: Payer: Self-pay | Admitting: Primary Care

## 2021-05-28 ENCOUNTER — Other Ambulatory Visit: Payer: Self-pay

## 2021-05-28 ENCOUNTER — Ambulatory Visit (INDEPENDENT_AMBULATORY_CARE_PROVIDER_SITE_OTHER): Payer: 59 | Admitting: Primary Care

## 2021-05-28 ENCOUNTER — Ambulatory Visit: Payer: 59 | Admitting: Adult Health

## 2021-05-28 DIAGNOSIS — R21 Rash and other nonspecific skin eruption: Secondary | ICD-10-CM

## 2021-05-28 NOTE — Assessment & Plan Note (Signed)
Wouldn't really call these rashes, but don't appear to be "boil"s, at least at this point.  Recommended to keep groin and under abdominal folds clean and dry, especially when working outdoors.  Sites have nearly healed. Recommended she touch base with her dermatologist and to take pictures if symptoms return.

## 2021-05-28 NOTE — Patient Instructions (Signed)
Touch base with your dermatologist as discussed.  Try to keep those areas as dry as possible when out in the heat.   It was a pleasure meeting you!

## 2021-05-28 NOTE — Progress Notes (Signed)
Subjective:    Patient ID: Kathleen Hull, female    DOB: March 02, 1966, 55 y.o.   MRN: [id number]  HPI  Kathleen Hull is a very pleasant 55 y.o. female patient of Mable Paris with a history of hypertension, thyroid disorder, rash who presents today to discuss groin rash.   She endorses a history of "boils" to the axilla, groin, and nose last year, was told that she has "MRSA", was treated with mupirocin, symptoms resolved.  One week ago she noticed a return of the bumps to her bilateral groin. Appear like "pimples" are tender, denies itching. Overall the spots have nearly healed. She's mostly concerned about her boils returning. She does work outdoors in the yard/garden, has been doing so recently.   She does have a dermatologist, has not mentioned these symptoms.   No new lotions, detergents, soaps or shampoos. No new medicines, vitamins, supplements. No new pets. No recent outdoor exposure or poison ivy exposure. No bonfire or smoke exposure.  No recent motel or hotel stay or new beds.   No fevers/chills, oral lesions, new joint pains, tick bites, abdominal pain, nausea.     Review of Systems  Constitutional:  Negative for fever.  Skin:        "Boils"        Past Medical History:  Diagnosis Date   Anxiety    Hypertension    Squamous cell skin cancer 2013    Social History   Socioeconomic History   Marital status: Married    Spouse name: Not on file   Number of children: Not on file   Years of education: Not on file   Highest education level: Not on file  Occupational History   Not on file  Tobacco Use   Smoking status: Never   Smokeless tobacco: Never  Vaping Use   Vaping Use: Never used  Substance and Sexual Activity   Alcohol use: No   Drug use: No   Sexual activity: Not on file  Other Topics Concern   Not on file  Social History Narrative   Lives in [place] with husband and 3 children.      Work - works at [employer], Print production planner       Diet - Healthy, regular      Exercise - walking   Social Determinants of Radio broadcast assistant Strain: Not on [employer] Insecurity: Not on file  Transportation Needs: Not on file  Physical Activity: Not on file  Stress: Not on file  Social Connections: Not on file  Intimate Partner Violence: Not on file    Past Surgical History:  Procedure Laterality Date   CHOLECYSTECTOMY     [place]   VAGINAL DELIVERY     3   WISDOM TOOTH EXTRACTION      Family History  Problem Relation Age of Onset   Thyroid disease Mother    Hypertension Mother    Diabetes Father    Hypertension Father    CAD Father    Breast cancer Neg Hx    Colon cancer Neg Hx    Thyroid cancer Neg Hx     No Known Allergies  Current Outpatient Medications on File Prior to Visit  Medication Sig Dispense Refill   amLODipine (NORVASC) 5 MG tablet TAKE 1 TABLET BY MOUTH EVERY DAY 30 tablet 5   buPROPion (WELLBUTRIN XL) 300 MG 24 hr tablet Take 1 tablet (300 mg total) by mouth daily. 90 tablet 3  metFORMIN (GLUCOPHAGE XR) 500 MG 24 hr tablet Start 500mg  PO qpm. 90 tablet 3   No current facility-administered medications on file prior to visit.    BP 128/86   Pulse 63   Temp 98.6 F (37 C) (Temporal)   Ht 5\' 4"  (1.626 m)   Wt 180 lb (81.6 kg)   LMP 09/09/2015 (Approximate)   SpO2 99%   BMI 30.90 kg/m  Objective:   Physical Exam Constitutional:      General: She is not in acute distress.    Appearance: She is not ill-appearing.  Skin:    General: Skin is warm and dry.     Comments: 5-8 small red, flat bumps to bilateral groin region and suprapubic region in healing stage. No surrounding erythema. No warmth. Non tender.           Assessment & Plan:      This visit occurred during the SARS-CoV-2 public health emergency.  Safety protocols were in place, including screening questions prior to the visit, additional usage of staff PPE, and extensive cleaning of exam room while observing  appropriate contact time as indicated for disinfecting solutions.

## 2021-05-30 ENCOUNTER — Other Ambulatory Visit: Payer: Self-pay | Admitting: Family

## 2021-05-30 DIAGNOSIS — Z0000 Encounter for general adult medical examination without abnormal findings: Secondary | ICD-10-CM

## 2021-05-30 DIAGNOSIS — I10 Essential (primary) hypertension: Secondary | ICD-10-CM

## 2021-07-07 ENCOUNTER — Other Ambulatory Visit: Payer: Self-pay | Admitting: Family

## 2021-07-07 DIAGNOSIS — Z1231 Encounter for screening mammogram for malignant neoplasm of breast: Secondary | ICD-10-CM

## 2021-07-10 ENCOUNTER — Ambulatory Visit
Admission: RE | Admit: 2021-07-10 | Discharge: 2021-07-10 | Disposition: A | Discharge status: Home / Self Care | Payer: 59 | Source: Ambulatory Visit | Attending: Family | Admitting: Family

## 2021-07-10 ENCOUNTER — Other Ambulatory Visit: Payer: Self-pay

## 2021-07-10 DIAGNOSIS — Z1231 Encounter for screening mammogram for malignant neoplasm of breast: Secondary | ICD-10-CM

## 2021-07-15 ENCOUNTER — Other Ambulatory Visit: Payer: Self-pay | Admitting: Family

## 2021-07-15 DIAGNOSIS — R928 Other abnormal and inconclusive findings on diagnostic imaging of breast: Secondary | ICD-10-CM

## 2021-07-20 ENCOUNTER — Other Ambulatory Visit: Payer: Self-pay

## 2021-07-20 ENCOUNTER — Ambulatory Visit
Admission: RE | Admit: 2021-07-20 | Discharge: 2021-07-20 | Disposition: A | Discharge status: Home / Self Care | Payer: 59 | Source: Ambulatory Visit | Attending: Family | Admitting: Family

## 2021-07-20 DIAGNOSIS — R928 Other abnormal and inconclusive findings on diagnostic imaging of breast: Secondary | ICD-10-CM

## 2022-01-25 ENCOUNTER — Other Ambulatory Visit: Payer: Self-pay | Admitting: Family

## 2022-01-25 DIAGNOSIS — E669 Obesity, unspecified: Secondary | ICD-10-CM

## 2022-02-07 DIAGNOSIS — G2581 Restless legs syndrome: Secondary | ICD-10-CM | POA: Insufficient documentation

## 2022-02-07 DIAGNOSIS — E785 Hyperlipidemia, unspecified: Secondary | ICD-10-CM | POA: Insufficient documentation

## 2022-02-07 DIAGNOSIS — F32A Depression, unspecified: Secondary | ICD-10-CM | POA: Insufficient documentation

## 2022-02-07 DIAGNOSIS — I639 Cerebral infarction, unspecified: Secondary | ICD-10-CM | POA: Insufficient documentation

## 2022-02-07 DIAGNOSIS — G4700 Insomnia, unspecified: Secondary | ICD-10-CM | POA: Insufficient documentation

## 2022-02-07 DIAGNOSIS — F419 Anxiety disorder, unspecified: Secondary | ICD-10-CM | POA: Insufficient documentation

## 2022-02-24 ENCOUNTER — Other Ambulatory Visit: Payer: Self-pay | Admitting: Family

## 2022-02-24 DIAGNOSIS — I10 Essential (primary) hypertension: Secondary | ICD-10-CM

## 2022-02-24 DIAGNOSIS — Z0000 Encounter for general adult medical examination without abnormal findings: Secondary | ICD-10-CM

## 2022-03-01 ENCOUNTER — Other Ambulatory Visit: Payer: Self-pay | Admitting: Family

## 2022-03-01 ENCOUNTER — Encounter: Payer: Self-pay | Admitting: Family

## 2022-03-01 DIAGNOSIS — E669 Obesity, unspecified: Secondary | ICD-10-CM

## 2022-03-01 DIAGNOSIS — I10 Essential (primary) hypertension: Secondary | ICD-10-CM

## 2022-03-01 DIAGNOSIS — Z0000 Encounter for general adult medical examination without abnormal findings: Secondary | ICD-10-CM

## 2022-03-02 ENCOUNTER — Other Ambulatory Visit: Payer: Self-pay

## 2022-03-02 DIAGNOSIS — E669 Obesity, unspecified: Secondary | ICD-10-CM

## 2022-03-02 MED ORDER — METFORMIN HCL ER 500 MG PO TB24: ORAL_TABLET | ORAL | 0 refills | Status: DC

## 2022-03-12 IMAGING — MG MM DIGITAL DIAGNOSTIC UNILAT*R* W/ TOMO W/ CAD
4 series · 4 of 12 positions shown · non-contrast
Comparison: Previous exam(s).

CLINICAL DATA: Screening recall for a possible right breast mass.

EXAM:
DIGITAL DIAGNOSTIC UNILATERAL RIGHT MAMMOGRAM WITH TOMOSYNTHESIS AND
CAD; ULTRASOUND RIGHT BREAST LIMITED
TECHNIQUE: Right digital diagnostic mammography and breast tomosynthesis was
performed. The images were evaluated with computer-aided detection.;
Targeted ultrasound examination of the right breast was performed

[R MLO synth-2D]
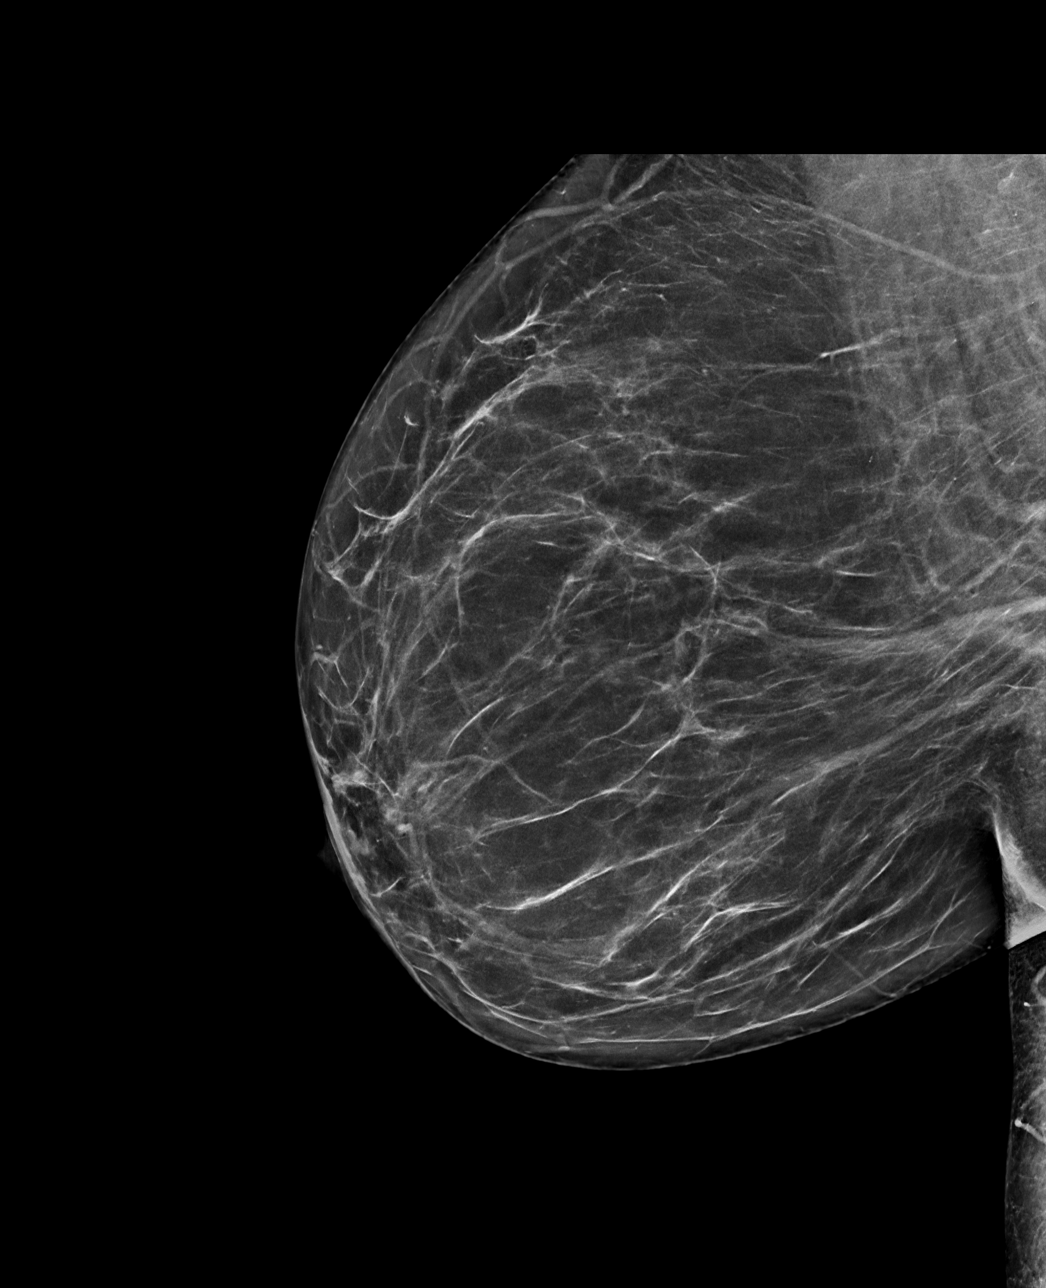

[R CC synth-2D]
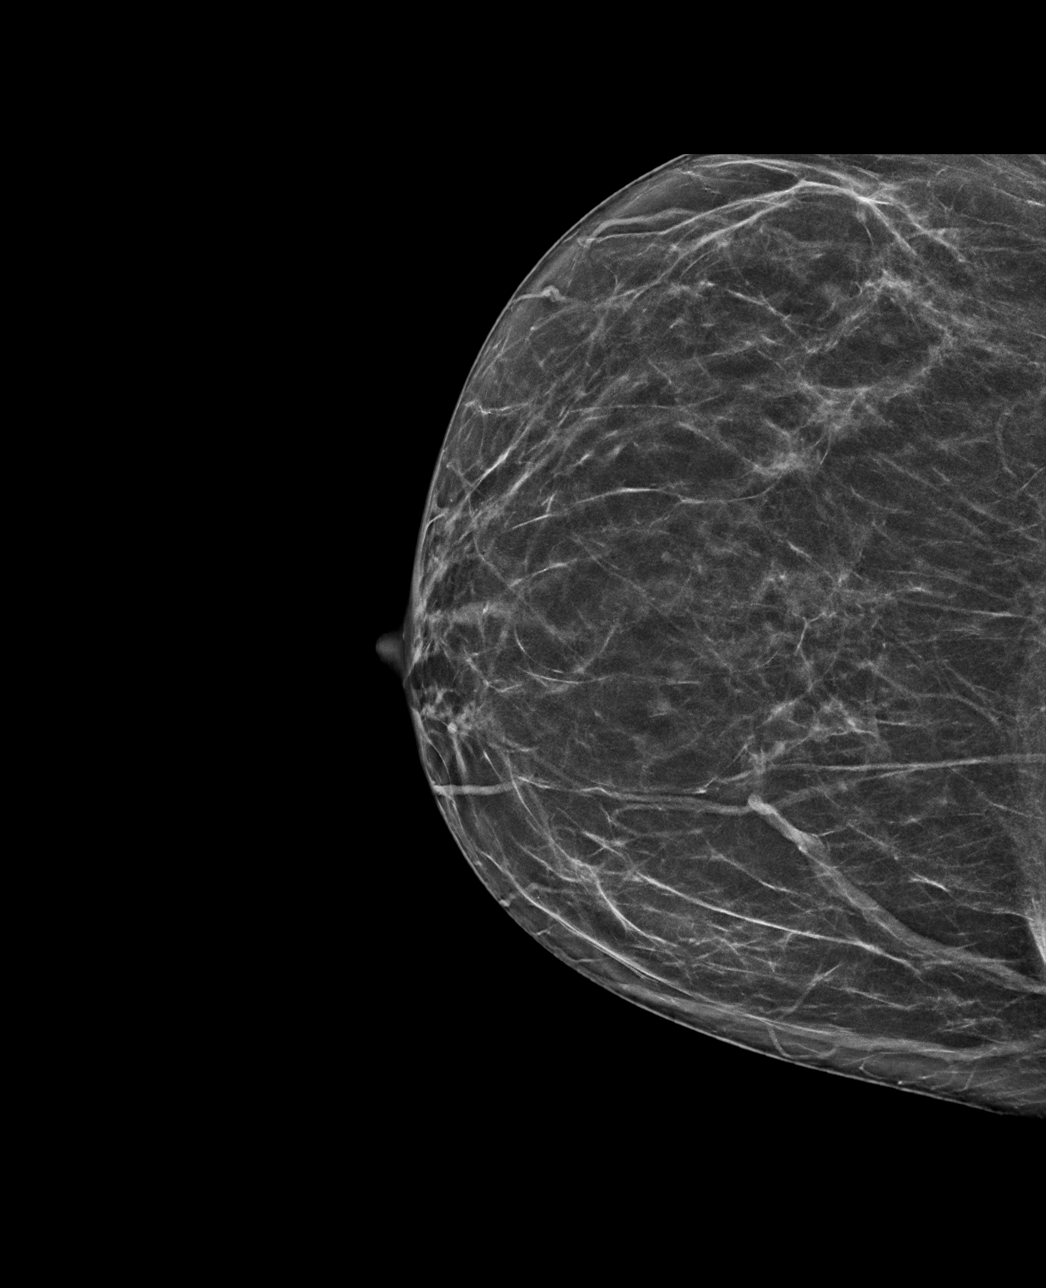

[R CC tomo · tomo slice 33/65.0]
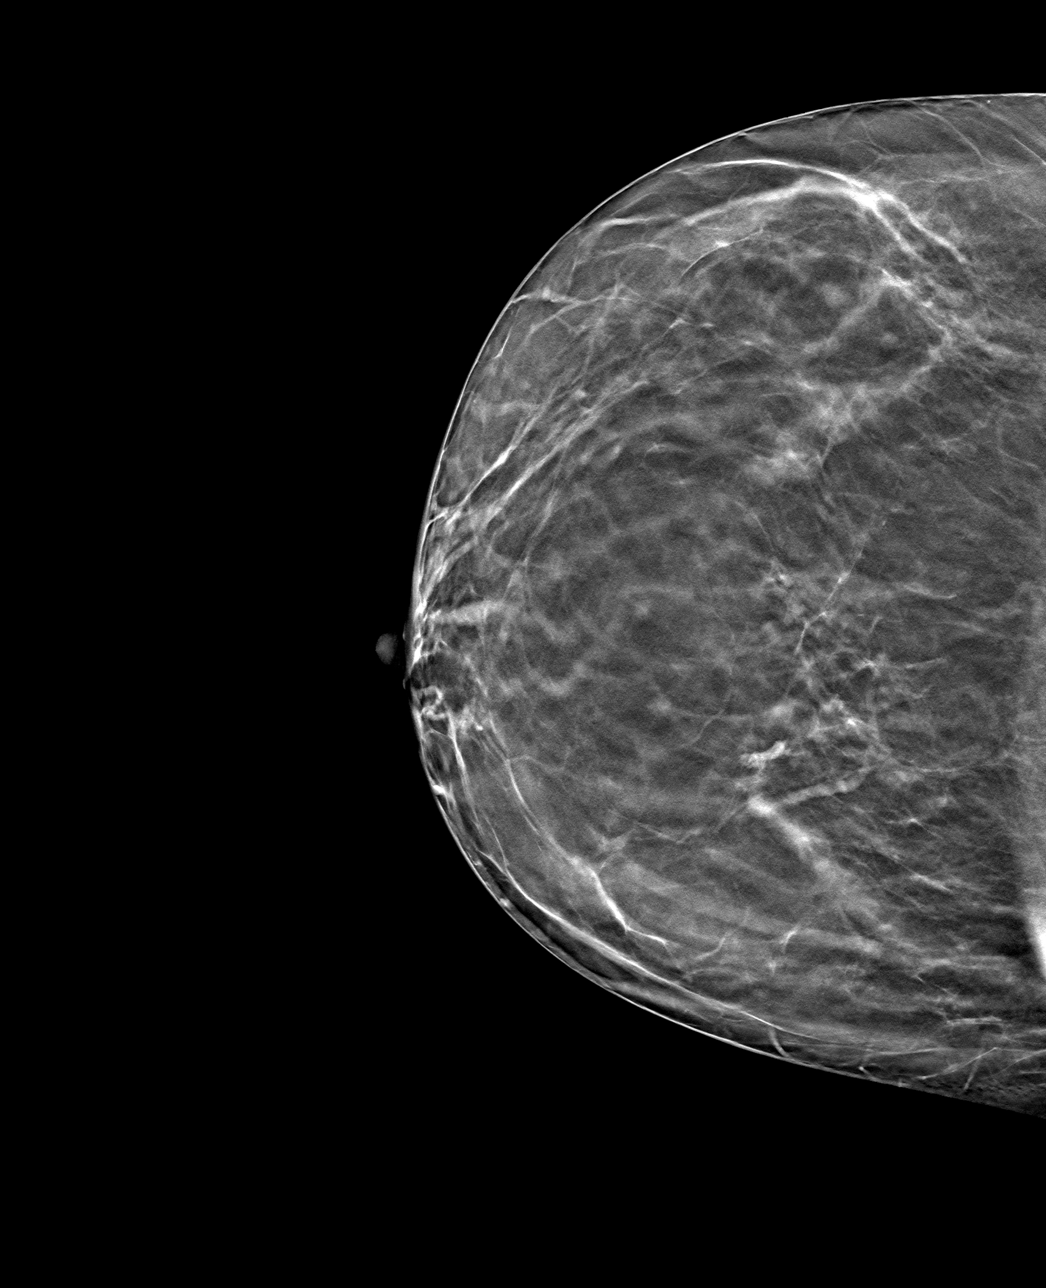

[R MLO tomo · tomo slice 40/79.0]
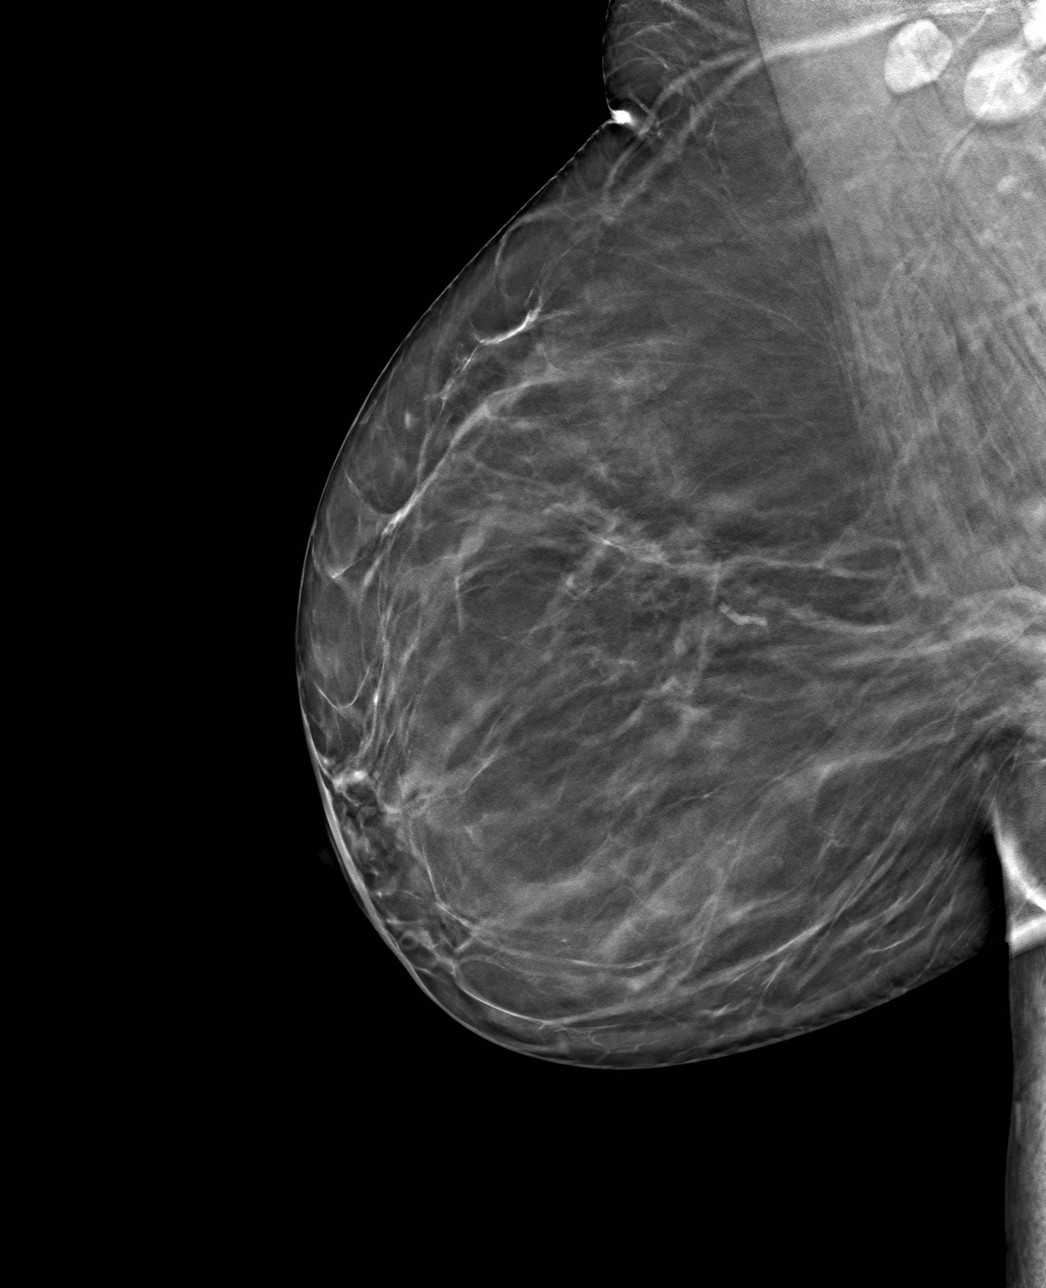

[4 of 12 positions shown; findings below may reference images not displayed]

ACR Breast Density Category b: There are scattered areas of
fibroglandular density.
FINDINGS: The possible mass, noted in the retroareolar right breast on the
current screening study, persists as an oval, mostly circumscribed
mass that appears to be a focally dilated duct. It measures
approximately 6 mm in long axis.

On physical exam, no mass is palpated in the retroareolar right
breast.

Targeted ultrasound is performed, showing a mild focal dilation of a
retroareolar duct measuring 5 x 2 x 5 mm, consistent in size, shape
and location to the mammographic finding. There is no intraductal
mass or debris. There are no other abnormalities.
IMPRESSION: 1. No evidence of breast malignancy.
2. Focal area of benign, mild duct ectasia in the retroareolar right
breast.

RECOMMENDATION:
Screening mammogram in one year.(Code:KN-S-96B)

I have discussed the findings and recommendations with the patient.
If applicable, a reminder letter will be sent to the patient
regarding the next appointment.

BI-RADS CATEGORY  2: Benign.

## 2022-03-18 ENCOUNTER — Ambulatory Visit (INDEPENDENT_AMBULATORY_CARE_PROVIDER_SITE_OTHER): Payer: 59 | Admitting: Dermatology

## 2022-03-18 DIAGNOSIS — L821 Other seborrheic keratosis: Secondary | ICD-10-CM

## 2022-03-18 DIAGNOSIS — Z1283 Encounter for screening for malignant neoplasm of skin: Secondary | ICD-10-CM

## 2022-03-18 DIAGNOSIS — L578 Other skin changes due to chronic exposure to nonionizing radiation: Secondary | ICD-10-CM

## 2022-03-18 DIAGNOSIS — L814 Other melanin hyperpigmentation: Secondary | ICD-10-CM

## 2022-03-18 DIAGNOSIS — D229 Melanocytic nevi, unspecified: Secondary | ICD-10-CM

## 2022-03-18 DIAGNOSIS — Z85828 Personal history of other malignant neoplasm of skin: Secondary | ICD-10-CM | POA: Diagnosis not present

## 2022-03-18 DIAGNOSIS — L570 Actinic keratosis: Secondary | ICD-10-CM | POA: Diagnosis not present

## 2022-03-18 DIAGNOSIS — D1800 Hemangioma unspecified site: Secondary | ICD-10-CM

## 2022-03-18 MED ORDER — FLUOROURACIL 5 % EX CREA
TOPICAL_CREAM | Freq: Two times a day (BID) | CUTANEOUS | 0 refills | Status: DC
Start: 2022-03-18 — End: 2023-05-24

## 2022-03-18 NOTE — Patient Instructions (Addendum)
Start 5-fluorouracil/calcipotriene cream twice a day for 7 days to affected areas including nose. Prescription sent to [facility]. Patient provided with contact information for pharmacy and advised the pharmacy will mail the prescription to their home. Patient provided with handout reviewing treatment course and side effects and advised to call or message [place] on MyChart with any concerns. ? ?5-Fluorouracil/Calcipotriene Patient Education  ? ?Actinic keratoses are the dry, red scaly spots on the skin caused by sun damage. A portion of these spots can turn into skin cancer with time, and treating them can help prevent development of skin cancer.  ? ?Treatment of these spots requires removal of the defective skin cells. There are various ways to remove actinic keratoses, including freezing with liquid nitrogen, treatment with creams, or treatment with a blue light procedure in the office.  ? ?5-fluorouracil cream is a topical cream used to treat actinic keratoses. It works by interfering with the growth of abnormal fast-growing skin cells, such as actinic keratoses. These cells peel off and are replaced by healthy ones.  ? ?5-fluorouracil/calcipotriene is a combination of the 5-fluorouracil cream with a vitamin D analog cream called calcipotriene. The calcipotriene alone does not treat actinic keratoses. However, when it is combined with 5-fluorouracil, it helps the 5-fluorouracil treat the actinic keratoses much faster so that the same results can be achieved with a much shorter treatment time. ? ?INSTRUCTIONS FOR 5-FLUOROURACIL/CALCIPOTRIENE CREAM:  ? ?5-fluorouracil/calcipotriene cream typically only needs to be used for 4-7 days. A thin layer should be applied twice a day to the treatment areas recommended by your physician.  ? ?If your physician prescribed you separate tubes of 5-fluourouracil and calcipotriene, apply a thin layer of 5-fluorouracil followed by a thin layer of calcipotriene.  ? ?Avoid contact  with your eyes, nostrils, and mouth. Do not use 5-fluorouracil/calcipotriene cream on infected or open wounds.  ? ?You will develop redness, irritation and some crusting at areas where you have pre-cancer damage/actinic keratoses. IF YOU DEVELOP PAIN, BLEEDING, OR SIGNIFICANT CRUSTING, STOP THE TREATMENT EARLY - you have already gotten a good response and the actinic keratoses should clear up well. ? ?Wash your hands after applying 5-fluorouracil 5% cream on your skin.  ? ?A moisturizer or sunscreen with a minimum SPF 30 should be applied each morning.  ? ?Once you have finished the treatment, you can apply a thin layer of Vaseline twice a day to irritated areas to soothe and calm the areas more quickly. If you experience significant discomfort, contact your physician. ? ?For some patients it is necessary to repeat the treatment for best results. ? ?SIDE EFFECTS: When using 5-fluorouracil/calcipotriene cream, you may have mild irritation, such as redness, dryness, swelling, or a mild burning sensation. This usually resolves within 2 weeks. The more actinic keratoses you have, the more redness and inflammation you can expect during treatment. Eye irritation has been reported rarely. If this occurs, please let us know.  ?If you have any trouble using this cream, please call the office. If you have any other questions about this information, please do not hesitate to ask me before you leave the office. ? ? If You Need Anything After Your Visit ? ?If you have any questions or concerns for your doctor, please call our main line at [phone] and press option 4 to reach your doctor's medical assistant. If no one answers, please leave a voicemail as directed and we will return your call as soon as possible. Messages left after 4 pm will be  answered the following business day.  ? ?You may also send [place] a message via MyChart. We typically respond to MyChart messages within 1-2 business days. ? ?For prescription refills,  please ask your pharmacy to contact our office. Our fax number is [phone]. ? ?If you have an urgent issue when the clinic is closed that cannot wait until the next business day, you can page your doctor at the number below.   ? ?Please note that while we do our best to be available for urgent issues outside of office hours, we are not available 24/7.  ? ?If you have an urgent issue and are unable to reach [place], you may choose to seek medical care at your doctor's office, retail clinic, urgent care center, or emergency room. ? ?If you have a medical emergency, please immediately call 911 or go to the emergency department. ? ?Pager Numbers ? ?- Dr. Nehemiah Massed: [phone] ? ?- Dr. Laurence Ferrari: [phone] ? ?- Dr. Nicole Kindred: [phone] ? ?In the event of inclement weather, please call our main line at [phone] for an update on the status of any delays or closures. ? ?Dermatology Medication Tips: ?Please keep the boxes that topical medications come in in order to help keep track of the instructions about where and how to use these. Pharmacies typically print the medication instructions only on the boxes and not directly on the medication tubes.  ? ?If your medication is too expensive, please contact our office at [phone] option 4 or send [place] a message through [place].  ? ?We are unable to tell what your co-pay for medications will be in advance as this is different depending on your insurance coverage. However, we may be able to find a substitute medication at lower cost or fill out paperwork to get insurance to cover a needed medication.  ? ?If a prior authorization is required to get your medication covered by your insurance company, please allow [place] 1-2 business days to complete this process. ? ?Drug prices often vary depending on where the prescription is filled and some pharmacies may offer cheaper prices. ? ?The website www.goodrx.com contains coupons for medications through different pharmacies. The prices  here do not account for what the cost may be with help from insurance (it may be cheaper with your insurance), but the website can give you the price if you did not use any insurance.  ?- You can print the associated coupon and take it with your prescription to the pharmacy.  ?- You may also stop by our office during regular business hours and pick up a GoodRx coupon card.  ?- If you need your prescription sent electronically to a different pharmacy, notify our office through [facility] or by phone at [phone] option 4. ? ? ? ? ?Si Usted Necesita Algo Despu?s de Su Visita ? ?Tambi?n puede enviarnos un mensaje a trav?s de MyChart. Por lo general respondemos a los mensajes de MyChart en el transcurso de 1 a 2 d?as h?biles. ? ?Para renovar recetas, por favor pida a su farmacia que se ponga en contacto con nuestra oficina. Nuestro n?mero de fax es el [phone]. ? ?Si tiene un asunto urgente cuando la cl?nica est? cerrada y que no puede esperar hasta el siguiente d?a h?bil, puede llamar/localizar a su doctor(a) al n?mero que aparece a continuaci?n.  ? ?Por favor, tenga en cuenta que aunque hacemos todo lo posible para estar disponibles para asuntos urgentes fuera del horario de oficina, no estamos disponibles las 24 horas del d?a,  los 7 d?as de la semana.  ? ?Si tiene un problema urgente y no puede comunicarse con nosotros, puede optar por buscar atenci?n m?dica  en el consultorio de su doctor(a), en una cl?nica privada, en un centro de atenci?n urgente o en una sala de emergencias. ? ?Si tiene Engineer, maintenance (IT) m?dica, por favor llame inmediatamente al 911 o vaya a la sala de emergencias. ? ?N?meros de b?per ? ?- Dr. Nehemiah Massed: [phone] ? ?- Dra. Moye: [phone] ? ?- Dra. Nicole Kindred: [phone] ? ?En caso de inclemencias del tiempo, por favor llame a nuestra l?nea principal al [phone] para una actualizaci?n sobre el estado de cualquier retraso o cierre. ? ?Consejos para la medicaci?n en  dermatolog?a: ?Por favor, guarde las cajas en las que vienen los medicamentos de uso t?pico para ayudarle a seguir las instrucciones sobre d?nde y c?mo usarlos. [place] instrucciones

## 2022-03-18 NOTE — Progress Notes (Signed)
? ?Follow-Up Visit ?  ?Subjective  ?Kathleen Hull is a 56 y.o. female who presents for the following: Annual Exam (Patient here for full body skin exam and skin cancer screening. Patient with hx SCC. Patient not aware of any new or changing spots. ). ?The patient presents for Total-Body Skin Exam (TBSE) for skin cancer screening and mole check.  The patient has spots, moles and lesions to be evaluated, some may be new or changing and the patient has concerns that these could be cancer. ? ?The following portions of the chart were reviewed this encounter and updated as appropriate:  ? Tobacco  Allergies  Meds  Problems  Med Hx  Surg Hx  Fam Hx   ?  ?Review of Systems:  No other skin or systemic complaints except as noted in HPI or Assessment and Plan. ? ?Objective  ?Well appearing patient in no apparent distress; mood and affect are within normal limits. ? ?A full examination was performed including scalp, head, eyes, ears, nose, lips, neck, chest, axillae, abdomen, back, buttocks, bilateral upper extremities, bilateral lower extremities, hands, feet, fingers, toes, fingernails, and toenails. All findings within normal limits unless otherwise noted below. ? ? ?Assessment & Plan  ?Actinic keratosis ? ?Related Medications ?fluorouracil (EFUDEX) 5 % cream ?Apply topically 2 (two) times daily. Apply to affected areas at nose twice daily for 7 days ? ?Skin cancer screening ? ?Lentigines ?- Scattered tan macules ?- Due to sun exposure ?- Benign-appearing, observe ?- Recommend daily broad spectrum sunscreen SPF 30+ to sun-exposed areas, reapply every 2 hours as needed. ?- Call for any changes ? ?Seborrheic Keratoses ?- Stuck-on, waxy, tan-brown papules and/or plaques  ?- Benign-appearing ?- Discussed benign etiology and prognosis. ?- Observe ?- Call for any changes ? ?Melanocytic Nevi ?- Tan-brown and/or pink-flesh-colored symmetric macules and papules ?- Benign appearing on exam today ?- Observation ?- Call clinic  for new or changing moles ?- Recommend daily use of broad spectrum spf 30+ sunscreen to sun-exposed areas.  ? ?Hemangiomas ?- Red papules ?- Discussed benign nature ?- Observe ?- Call for any changes ? ?Actinic Damage - Severe, confluent actinic changes with pre-cancerous actinic keratoses  ?- Severe, chronic, not at goal, secondary to cumulative UV radiation exposure over time ?- diffuse scaly erythematous macules and papules with underlying dyspigmentation ?- Discussed Prescription "Field Treatment" for Severe, Chronic Confluent Actinic Changes with Pre-Cancerous Actinic Keratoses ?Field treatment involves treatment of an entire area of skin that has confluent Actinic Changes (Sun/ Ultraviolet light damage) and PreCancerous Actinic Keratoses by method of PhotoDynamic Therapy (PDT) and/or prescription Topical Chemotherapy agents such as 5-fluorouracil, 5-fluorouracil/calcipotriene, and/or imiquimod.  The purpose is to decrease the number of clinically evident and subclinical PreCancerous lesions to prevent progression to development of skin cancer by chemically destroying early precancer changes that may or may not be visible.  It has been shown to reduce the risk of developing skin cancer in the treated area. As a result of treatment, redness, scaling, crusting, and open sores may occur during treatment course. One or more than one of these methods may be used and may have to be used several times to control, suppress and eliminate the PreCancerous changes. Discussed treatment course, expected reaction, and possible side effects. ?- Recommend daily broad spectrum sunscreen SPF 30+ to sun-exposed areas, reapply every 2 hours as needed.  ?- Staying in the shade or wearing long sleeves, sun glasses (UVA+UVB protection) and wide brim hats (4-inch brim around the entire circumference of the  hat) are also recommended. ?- Call for new or changing lesions. ? ?Start 5-fluorouracil/calcipotriene cream twice a day for 7 days  to affected areas including nose. Prescription sent to [facility]. Patient provided with contact information for pharmacy and advised the pharmacy will mail the prescription to their home. Patient provided with handout reviewing treatment course and side effects and advised to call or message [place] on MyChart with any concerns. ? ?Skin cancer screening performed today. ? ?History of Squamous Cell Carcinoma of the Skin ?- No evidence of recurrence today ?- No lymphadenopathy ?- Recommend regular full body skin exams ?- Recommend daily broad spectrum sunscreen SPF 30+ to sun-exposed areas, reapply every 2 hours as needed.  ?- Call if any new or changing lesions are noted between office visits ? ?Return in about 1 year (around 03/19/2023) for TBSE. ? ?Graciella Belton, RMA, am acting as scribe for Sarina Ser, MD . ?Documentation: I have reviewed the above documentation for accuracy and completeness, and I agree with the above. ? ?Sarina Ser, MD ? ?

## 2022-03-19 ENCOUNTER — Encounter: Payer: Self-pay | Admitting: Dermatology

## 2022-04-04 ENCOUNTER — Other Ambulatory Visit: Payer: Self-pay | Admitting: Family

## 2022-04-04 DIAGNOSIS — I10 Essential (primary) hypertension: Secondary | ICD-10-CM

## 2022-04-04 DIAGNOSIS — Z0000 Encounter for general adult medical examination without abnormal findings: Secondary | ICD-10-CM

## 2022-04-05 MED ORDER — AMLODIPINE BESYLATE 5 MG PO TABS: 5.0000 mg | ORAL_TABLET | Freq: Every day | ORAL | 0 refills | Status: DC

## 2022-04-05 MED ORDER — BUPROPION HCL ER (XL) 300 MG PO TB24: 300.0000 mg | ORAL_TABLET | Freq: Every day | ORAL | 0 refills | Status: DC

## 2022-04-25 ENCOUNTER — Other Ambulatory Visit: Payer: Self-pay | Admitting: Family

## 2022-04-25 DIAGNOSIS — E669 Obesity, unspecified: Secondary | ICD-10-CM

## 2022-04-26 ENCOUNTER — Encounter: Payer: 59 | Admitting: Family

## 2022-05-08 ENCOUNTER — Other Ambulatory Visit: Payer: Self-pay | Admitting: Family

## 2022-05-08 DIAGNOSIS — I10 Essential (primary) hypertension: Secondary | ICD-10-CM

## 2022-05-08 DIAGNOSIS — Z0000 Encounter for general adult medical examination without abnormal findings: Secondary | ICD-10-CM

## 2022-05-11 MED ORDER — AMLODIPINE BESYLATE 5 MG PO TABS
5.0000 mg | ORAL_TABLET | Freq: Every day | ORAL | 0 refills | Status: DC
Start: 2022-05-11 — End: 2022-05-19

## 2022-05-19 ENCOUNTER — Ambulatory Visit (INDEPENDENT_AMBULATORY_CARE_PROVIDER_SITE_OTHER): Payer: 59 | Admitting: Family

## 2022-05-19 ENCOUNTER — Telehealth: Payer: Self-pay

## 2022-05-19 ENCOUNTER — Encounter: Payer: Self-pay | Admitting: Family

## 2022-05-19 VITALS — BP 118/62 | HR 71 | Temp 97.9°F | Ht 64.0 in | Wt 173.8 lb

## 2022-05-19 DIAGNOSIS — I10 Essential (primary) hypertension: Secondary | ICD-10-CM | POA: Diagnosis not present

## 2022-05-19 DIAGNOSIS — Z0000 Encounter for general adult medical examination without abnormal findings: Secondary | ICD-10-CM | POA: Diagnosis not present

## 2022-05-19 DIAGNOSIS — Z1231 Encounter for screening mammogram for malignant neoplasm of breast: Secondary | ICD-10-CM

## 2022-05-19 DIAGNOSIS — E559 Vitamin D deficiency, unspecified: Secondary | ICD-10-CM | POA: Diagnosis not present

## 2022-05-19 DIAGNOSIS — Z1211 Encounter for screening for malignant neoplasm of colon: Secondary | ICD-10-CM

## 2022-05-19 DIAGNOSIS — Z23 Encounter for immunization: Secondary | ICD-10-CM | POA: Diagnosis not present

## 2022-05-19 DIAGNOSIS — E669 Obesity, unspecified: Secondary | ICD-10-CM

## 2022-05-19 LAB — COMPREHENSIVE METABOLIC PANEL
ALT: 17 U/L (ref 0–35)
AST: 16 U/L (ref 0–37)
Albumin: 4.4 g/dL (ref 3.5–5.2)
Alkaline Phosphatase: 77 U/L (ref 39–117)
BUN: 21 mg/dL (ref 6–23)
CO2: 26 mEq/L (ref 19–32)
Calcium: 9.6 mg/dL (ref 8.4–10.5)
Chloride: 107 mEq/L (ref 96–112)
Creatinine, Ser: 0.98 mg/dL (ref 0.40–1.20)
GFR: 65.9 mL/min (ref >60.00)
Glucose, Bld: 76 mg/dL (ref 70–99)
Potassium: 3.7 mEq/L (ref 3.5–5.1)
Sodium: 142 mEq/L (ref 135–145)
Total Bilirubin: 0.5 mg/dL (ref 0.2–1.2)
Total Protein: 6.7 g/dL (ref 6.0–8.3)

## 2022-05-19 LAB — CBC WITH DIFFERENTIAL/PLATELET
Basophils Absolute: 0 10*3/uL (ref 0.0–0.1)
Basophils Relative: 0.3 % (ref 0.0–3.0)
Eosinophils Absolute: 0.1 10*3/uL (ref 0.0–0.7)
Eosinophils Relative: 0.8 % (ref 0.0–5.0)
HCT: 36.6 % (ref 36.0–46.0)
Hemoglobin: 12.4 g/dL (ref 12.0–15.0)
Lymphocytes Relative: 31.8 % (ref 12.0–46.0)
Lymphs Abs: 3.5 10*3/uL (ref 0.7–4.0)
MCHC: 33.8 g/dL (ref 30.0–36.0)
MCV: 96.4 fl (ref 78.0–100.0)
Monocytes Absolute: 0.6 10*3/uL (ref 0.1–1.0)
Monocytes Relative: 5 % (ref 3.0–12.0)
Neutro Abs: 6.8 10*3/uL (ref 1.4–7.7)
Neutrophils Relative %: 62.1 % (ref 43.0–77.0)
Platelets: 306 10*3/uL (ref 150.0–400.0)
RBC: 3.8 Mil/uL — ABNORMAL LOW (ref 3.87–5.11)
RDW: 12.6 % (ref 11.5–15.5)
WBC: 11 10*3/uL — ABNORMAL HIGH (ref 4.0–10.5)

## 2022-05-19 LAB — LIPID PANEL
Cholesterol: 183 mg/dL (ref 0–200)
HDL: 59 mg/dL (ref >39.00)
LDL Cholesterol: 95 mg/dL (ref 0–99)
NonHDL: 123.5
Total CHOL/HDL Ratio: 3
Triglycerides: 145 mg/dL (ref 0.0–149.0)
VLDL: 29 mg/dL (ref 0.0–40.0)

## 2022-05-19 LAB — VITAMIN D 25 HYDROXY (VIT D DEFICIENCY, FRACTURES): VITD: 70.63 ng/mL (ref 30.00–100.00)

## 2022-05-19 LAB — TSH: TSH: 1.41 u[IU]/mL (ref 0.35–5.50)

## 2022-05-19 LAB — HEMOGLOBIN A1C: Hgb A1c MFr Bld: 4.9 % (ref 4.6–6.5)

## 2022-05-19 MED ORDER — METFORMIN HCL ER 500 MG PO TB24: 500.0000 mg | ORAL_TABLET | Freq: Two times a day (BID) | ORAL | 3 refills | Status: DC

## 2022-05-19 NOTE — Telephone Encounter (Signed)
CALLED PATIENT NO ANSWER LEFT VOICEMAIL FOR A CALL BACK ? ?

## 2022-05-19 NOTE — Assessment & Plan Note (Signed)
CBE performed today. Patient will schedule mammogram.  Encourage continued exercise.  Patient is  currently being treated for poison ivy with 6-day course of prednisone, improving.  I asked her to call us if she would require an extension of prednisone which I be happy to prescribe.   colonoscopy due and this referral has been placed.  Tdap given today.  Deferred pelvic exam in the absence of complaints and Pap smear is up-to-date

## 2022-05-19 NOTE — Addendum Note (Signed)
Addended by: [place], Petula Rotolo on: 05/19/2022 10:15 AM   Modules accepted: Orders

## 2022-05-19 NOTE — Patient Instructions (Addendum)
You may stop amlodipine.  Please continue to spot check blood pressure as we discussed today.  Goal of blood pressure is less than 120/80   lets trial metformin  Start metformin XR with one '500mg'$  tablet at night. After one week, you may increase to two tablets at night ( total of '1000mg'$ ) . The third week, you may take take two tablets at night and one tablet in the morning.  The fourth week, you may take two tablets in the morning ( '1000mg'$  total) and two tablets at night ('1000mg'$  total). This will bring you to a maximum daily dose of '2000mg'$ /day which is maximum dose. Along the way, if you want to increase more slowly, please do as this medication can cause GI discomfort and loose stools which usually get better with time , however some patients find that they can only tolerate a certain dose and cannot increase to maximum dose.    Please call to schedule mammogram  Health Maintenance, Female Adopting a healthy lifestyle and getting preventive care are important in promoting health and wellness. Ask your health care provider about: The right schedule for you to have regular tests and exams. Things you can do on your own to prevent diseases and keep yourself healthy. What should I know about diet, weight, and exercise? Eat a healthy diet  Eat a diet that includes plenty of vegetables, fruits, low-fat dairy products, and lean protein. Do not eat a lot of foods that are high in solid fats, added sugars, or sodium. Maintain a healthy weight Body mass index (BMI) is used to identify weight problems. It estimates body fat based on height and weight. Your health care provider can help determine your BMI and help you achieve or maintain a healthy weight. Get regular exercise Get regular exercise. This is one of the most important things you can do for your health. Most adults should: Exercise for at least 150 minutes each week. The exercise should increase your heart rate and make you sweat  (moderate-intensity exercise). Do strengthening exercises at least twice a week. This is in addition to the moderate-intensity exercise. Spend less time sitting. Even light physical activity can be beneficial. Watch cholesterol and blood lipids Have your blood tested for lipids and cholesterol at 54 years of age, then have this test every 5 years. Have your cholesterol levels checked more often if: Your lipid or cholesterol levels are high. You are older than 54 years of age. You are at high risk for heart disease. What should I know about cancer screening? Depending on your health history and family history, you may need to have cancer screening at various ages. This may include screening for: Breast cancer. Cervical cancer. Colorectal cancer. Skin cancer. Lung cancer. What should I know about heart disease, diabetes, and high blood pressure? Blood pressure and heart disease High blood pressure causes heart disease and increases the risk of stroke. This is more likely to develop in people who have high blood pressure readings or are overweight. Have your blood pressure checked: Every 3-5 years if you are 75-1 years of age. Every year if you are 36 years old or older. Diabetes Have regular diabetes screenings. This checks your fasting blood sugar level. Have the screening done: Once every three years after age 3 if you are at a normal weight and have a low risk for diabetes. More often and at a younger age if you are overweight or have a high risk for diabetes. What should I know  about preventing infection? Hepatitis B If you have a higher risk for hepatitis B, you should be screened for this virus. Talk with your health care provider to find out if you are at risk for hepatitis B infection. Hepatitis C Testing is recommended for: Everyone born from [birth year] through 1963. Anyone with known risk factors for hepatitis C. Sexually transmitted infections (STIs) Get screened for STIs,  including gonorrhea and chlamydia, if: You are sexually active and are younger than 54 years of age. You are older than 54 years of age and your health care provider tells you that you are at risk for this type of infection. Your sexual activity has changed since you were last screened, and you are at increased risk for chlamydia or gonorrhea. Ask your health care provider if you are at risk. Ask your health care provider about whether you are at high risk for HIV. Your health care provider may recommend a prescription medicine to help prevent HIV infection. If you choose to take medicine to prevent HIV, you should first get tested for HIV. You should then be tested every 3 months for as long as you are taking the medicine. Pregnancy If you are about to stop having your period (premenopausal) and you may become pregnant, seek counseling before you get pregnant. Take 400 to 800 micrograms (mcg) of folic acid every day if you become pregnant. Ask for birth control (contraception) if you want to prevent pregnancy. Osteoporosis and menopause Osteoporosis is a disease in which the bones lose minerals and strength with aging. This can result in bone fractures. If you are 57 years old or older, or if you are at risk for osteoporosis and fractures, ask your health care provider if you should: Be screened for bone loss. Take a calcium or vitamin D supplement to lower your risk of fractures. Be given hormone replacement therapy (HRT) to treat symptoms of menopause. Follow these instructions at home: Alcohol use Do not drink alcohol if: Your health care provider tells you not to drink. You are pregnant, may be pregnant, or are planning to become pregnant. If you drink alcohol: Limit how much you have to: 0-1 drink a day. Know how much alcohol is in your drink. In the [place] one drink equals one 12 oz bottle of beer (355 mL), one 5 oz glass of wine (148 mL), or one 1 oz glass of hard liquor (44  mL). Lifestyle Do not use any products that contain nicotine or tobacco. These products include cigarettes, chewing tobacco, and vaping devices, such as e-cigarettes. If you need help quitting, ask your health care provider. Do not use street drugs. Do not share needles. Ask your health care provider for help if you need support or information about quitting drugs. General instructions Schedule regular health, dental, and eye exams. Stay current with your vaccines. Tell your health care provider if: You often feel depressed. You have ever been abused or do not feel safe at home. Summary Adopting a healthy lifestyle and getting preventive care are important in promoting health and wellness. Follow your health care provider's instructions about healthy diet, exercising, and getting tested or screened for diseases. Follow your health care provider's instructions on monitoring your cholesterol and blood pressure. This information is not intended to replace advice given to you by your health care provider. Make sure you discuss any questions you have with your health care provider. Document Revised: 04/20/2021 Document Reviewed: 04/20/2021 Elsevier Patient Education  [place].

## 2022-05-19 NOTE — Assessment & Plan Note (Signed)
Blood pressure today is off of amlodipine. She has been monitoring at home with similar readings. We agreed to stop amlodipine and will continue to spot check blood pressure at home with goal less than 120/80

## 2022-05-19 NOTE — Progress Notes (Signed)
Subjective:    Patient ID: Kathleen Hull, female    DOB: 09-Oct-1966, 56 y.o.   MRN: [id number]  CC: Kathleen Hull is a 56 y.o. female who presents today for physical exam.    HPI: Feels well today She is currently on 6 day prednisone taper for poison ivy which she carried in her yard 5 days ago. Lesions have stopped spreading from abdomen and drying up   Squamous cell cancer-continues to follow with dermatology  Colorectal Cancer Screening: due Breast Cancer Screening: Mammogram UTD Cervical Cancer Screening: UTD, negative malignancy HPV 08/08/2020 Bone Health screening/DEXA for 65+: No increased fracture risk. Defer screening at this time.  Lung Cancer Screening: Doesn't have 20 year pack year history and age > 84 years yo 63 years        Tetanus - due        Labs: Screening labs today. Exercise: Gets regular exercise, walking , weight class Alcohol use:  none Smoking/tobacco use: Nonsmoker.     HISTORY:  Past Medical History:  Diagnosis Date   Anxiety    Hypertension    Squamous cell skin cancer 2013    Past Surgical History:  Procedure Laterality Date   CHOLECYSTECTOMY     [place]   VAGINAL DELIVERY     3   WISDOM TOOTH EXTRACTION     Family History  Problem Relation Age of Onset   Thyroid disease Mother    Hypertension Mother    Diabetes Father    Hypertension Father    CAD Father    Breast cancer Neg Hx    Colon cancer Neg Hx    Thyroid cancer Neg Hx       ALLERGIES: Patient has no known allergies.  Current Outpatient Medications on File Prior to Visit  Medication Sig Dispense Refill   buPROPion (WELLBUTRIN XL) 300 MG 24 hr tablet Take 1 tablet (300 mg total) by mouth daily. 90 tablet 0   fluorouracil (EFUDEX) 5 % cream Apply topically 2 (two) times daily. Apply to affected areas at nose twice daily for 7 days 15 g 0   No current facility-administered medications on file prior to visit.    Social History   Tobacco Use   Smoking status:  Never   Smokeless tobacco: Never  Vaping Use   Vaping Use: Never used  Substance Use Topics   Alcohol use: No   Drug use: No    Review of Systems  Constitutional:  Negative for chills, fever and unexpected weight change.  HENT:  Negative for congestion.   Respiratory:  Negative for cough.   Cardiovascular:  Negative for chest pain, palpitations and leg swelling.  Gastrointestinal:  Negative for nausea and vomiting.  Musculoskeletal:  Negative for arthralgias and myalgias.  Skin:  Positive for rash.  Neurological:  Negative for headaches.  Hematological:  Negative for adenopathy.  Psychiatric/Behavioral:  Negative for confusion.      Objective:    BP 118/62 (BP Location: Left Arm, Patient Position: Sitting, Cuff Size: Normal)   Pulse 71   Temp 97.9 F (36.6 C) (Oral)   Ht '5\' 4"'$  (1.626 m)   Wt 173 lb 12.8 oz (78.8 kg)   LMP 09/09/2015 (Approximate)   SpO2 98%   BMI 29.83 kg/m   BP Readings from Last 3 Encounters:  05/19/22 118/62  05/28/21 128/86  01/21/21 128/78   Wt Readings from Last 3 Encounters:  05/19/22 173 lb 12.8 oz (78.8 kg)  05/28/21 180 lb (81.6 kg)  01/21/21 190 lb 9.6 oz (86.5 kg)    Physical Exam Vitals reviewed.  Constitutional:      Appearance: Normal appearance. She is well-developed.  Eyes:     Conjunctiva/sclera: Conjunctivae normal.  Neck:     Thyroid: No thyroid mass or thyromegaly.  Cardiovascular:     Rate and Rhythm: Normal rate and regular rhythm.     Pulses: Normal pulses.     Heart sounds: Normal heart sounds.  Pulmonary:     Effort: Pulmonary effort is normal.     Breath sounds: Normal breath sounds. No wheezing, rhonchi or rales.  Chest:  Breasts:    Breasts are symmetrical.     Right: No inverted nipple, mass, nipple discharge, skin change or tenderness.     Left: No inverted nipple, mass, nipple discharge, skin change or tenderness.  Abdominal:     General: Bowel sounds are normal. There is no distension.     Palpations:  Abdomen is soft. Abdomen is not rigid. There is no fluid wave or mass.     Tenderness: There is no abdominal tenderness. There is no guarding or rebound.  Genitourinary:    Cervix: No cervical motion tenderness, discharge or friability.     Uterus: Not enlarged, not fixed and not tender.      Adnexa:        Right: No mass, tenderness or fullness.         Left: No mass, tenderness or fullness.       Comments: Pap performed. No CMT. Unable to appreciated ovaries. Lymphadenopathy:     Head:     Right side of head: No submental, submandibular, tonsillar, preauricular, posterior auricular or occipital adenopathy.     Left side of head: No submental, submandibular, tonsillar, preauricular, posterior auricular or occipital adenopathy.     Cervical:     Right cervical: No superficial, deep or posterior cervical adenopathy.    Left cervical: No superficial, deep or posterior cervical adenopathy.     Upper Body:     Right upper body: No pectoral adenopathy.     Left upper body: No pectoral adenopathy.  Skin:    General: Skin is warm and dry.          Comments: Grouped vesicular lesions on erythematous base. No drainage.   Neurological:     Mental Status: She is alert.  Psychiatric:        Speech: Speech normal.        Behavior: Behavior normal.        Thought Content: Thought content normal.       Assessment & Plan:   Problem List Items Addressed This Visit       Cardiovascular and Mediastinum   HTN (hypertension) - Primary    Blood pressure today is off of amlodipine. She has been monitoring at home with similar readings. We agreed to stop amlodipine and will continue to spot check blood pressure at home with goal less than 120/80       Relevant Orders   Comprehensive metabolic panel   CBC with Differential/Platelet   Hemoglobin A1c   Lipid panel   TSH     Other   Obesity (BMI 30.0-34.9)    Congratulated  patient on weight loss.  She has done very well metformin.  We  agreed to slowly titrate medication from 500 mg to 2000 mg/day to further aid in weight loss.  She will let me know how she is doing  Relevant Medications   metFORMIN (GLUCOPHAGE-XR) 500 MG 24 hr tablet   Routine general medical examination at a health care facility    CBE performed today. Patient will schedule mammogram.  Encourage continued exercise.  Patient is  currently being treated for poison ivy with 6-day course of prednisone, improving.  I asked her to call us if she would require an extension of prednisone which I be happy to prescribe.   colonoscopy due and this referral has been placed.  Tdap given today.  Deferred pelvic exam in the absence of complaints and Pap smear is up-to-date                                                                  Screening for breast cancer   Relevant Orders   MM 3D SCREEN BREAST BILATERAL   Vitamin D deficiency   Relevant Orders   VITAMIN D 25 Hydroxy (Vit-D Deficiency, Fractures)   Other Visit Diagnoses     Colon cancer screening       Relevant Orders   Ambulatory referral to Gastroenterology        I have discontinued Quinn Axe. Bruna's metFORMIN and amLODipine. I have also changed her metFORMIN. Additionally, I am having her maintain her fluorouracil and buPROPion.   Meds ordered this encounter  Medications   metFORMIN (GLUCOPHAGE-XR) 500 MG 24 hr tablet    Sig: Take 1 tablet (500 mg total) by mouth 2 (two) times daily.    Dispense:  180 tablet    Refill:  3    Order Specific Question:   Supervising Provider    Answer:   Crecencio Mc [[id number]]    Return precautions given.   Risks, benefits, and alternatives of the medications and treatment plan prescribed today were discussed, and patient expressed understanding.   Education regarding symptom management and diagnosis given to patient on AVS.   Continue to follow with Burnard Hawthorne, FNP for routine health maintenance.   Jannette Fogo and I agreed with plan.    Mable Paris, FNP

## 2022-05-19 NOTE — Assessment & Plan Note (Signed)
Congratulated  patient on weight loss.  She has done very well metformin.  We agreed to slowly titrate medication from 500 mg to 2000 mg/day to further aid in weight loss.  She will let me know how she is doing

## 2022-05-19 NOTE — Progress Notes (Signed)
Patient wants to come off Amlodopine? Wants to get colonoscopy/ placed referral for it

## 2022-05-24 ENCOUNTER — Telehealth: Payer: Self-pay

## 2022-05-24 NOTE — Telephone Encounter (Signed)
Reminder letter sent  no answer

## 2022-05-25 ENCOUNTER — Other Ambulatory Visit: Payer: Self-pay | Admitting: Family

## 2022-05-25 DIAGNOSIS — Z0000 Encounter for general adult medical examination without abnormal findings: Secondary | ICD-10-CM

## 2022-05-25 DIAGNOSIS — I10 Essential (primary) hypertension: Secondary | ICD-10-CM

## 2022-05-26 ENCOUNTER — Encounter: Payer: Self-pay | Admitting: Family

## 2022-05-26 ENCOUNTER — Other Ambulatory Visit: Payer: Self-pay | Admitting: Family

## 2022-05-26 DIAGNOSIS — L249 Irritant contact dermatitis, unspecified cause: Secondary | ICD-10-CM

## 2022-05-26 MED ORDER — PREDNISONE 10 MG PO TABS
ORAL_TABLET | ORAL | 0 refills | Status: DC
Start: 2022-05-26 — End: 2023-02-23

## 2022-07-21 ENCOUNTER — Ambulatory Visit
Admission: RE | Admit: 2022-07-21 | Discharge: 2022-07-21 | Disposition: A | Discharge status: Home / Self Care | Payer: 59 | Source: Ambulatory Visit | Attending: Family | Admitting: Family

## 2022-07-21 DIAGNOSIS — Z1231 Encounter for screening mammogram for malignant neoplasm of breast: Secondary | ICD-10-CM

## 2022-07-27 ENCOUNTER — Ambulatory Visit (INDEPENDENT_AMBULATORY_CARE_PROVIDER_SITE_OTHER): Payer: 59 | Admitting: PULMONARY DISEASE

## 2022-08-23 ENCOUNTER — Other Ambulatory Visit: Payer: Self-pay | Admitting: Family

## 2022-08-23 DIAGNOSIS — I10 Essential (primary) hypertension: Secondary | ICD-10-CM

## 2022-08-23 DIAGNOSIS — Z0000 Encounter for general adult medical examination without abnormal findings: Secondary | ICD-10-CM

## 2022-11-09 ENCOUNTER — Encounter: Payer: Self-pay | Admitting: Family

## 2023-01-06 ENCOUNTER — Other Ambulatory Visit: Payer: Self-pay | Admitting: Family

## 2023-01-07 MED ORDER — BUPROPION HCL ER (XL) 300 MG PO TB24: 300.0000 mg | ORAL_TABLET | Freq: Every day | ORAL | 0 refills | Status: DC

## 2023-02-23 ENCOUNTER — Encounter: Payer: Self-pay | Admitting: Family

## 2023-02-23 ENCOUNTER — Ambulatory Visit (INDEPENDENT_AMBULATORY_CARE_PROVIDER_SITE_OTHER): Payer: 59 | Admitting: Family

## 2023-02-23 VITALS — BP 120/70 | HR 73 | Temp 97.9°F | Ht 64.0 in | Wt 193.4 lb

## 2023-02-23 DIAGNOSIS — Z1211 Encounter for screening for malignant neoplasm of colon: Secondary | ICD-10-CM | POA: Diagnosis not present

## 2023-02-23 DIAGNOSIS — E669 Obesity, unspecified: Secondary | ICD-10-CM

## 2023-02-23 MED ORDER — PHENTERMINE HCL 37.5 MG PO TABS: 37.5000 mg | ORAL_TABLET | Freq: Every day | ORAL | 2 refills | Status: DC

## 2023-02-23 NOTE — Progress Notes (Signed)
Assessment & Plan:  Obesity (BMI 30.0-34.9) Assessment & Plan: Patient will stop metformin.  Trial of phentermine half tablet and then patient will will take full tablet of 37.5 mg.  We discussed duration of 3 months, potentially  6 months if she is actively losing weight.  Patient counseled on side effects to stay vigilant for.  Orders: -     Phentermine HCl; Take 1 tablet (37.5 mg total) by mouth daily before breakfast.  Dispense: 30 tablet; Refill: 2  Screening for colon cancer -     Ambulatory referral to Gastroenterology     Return precautions given.   Risks, benefits, and alternatives of the medications and treatment plan prescribed today were discussed, and patient expressed understanding.   Education regarding symptom management and diagnosis given to patient on AVS either electronically or printed.  No follow-ups on file.  Mable Paris, FNP  Subjective:    Patient ID: Kathleen Hull, female    DOB: 02-02-1966, 57 y.o.   MRN: [id number]  CC: Kathleen Hull is a 57 y.o. female who presents today for follow up.   HPI: Feels well today.  No new complaints.  Here to discuss weight gain.  Has been compliant metformin 1000 mg continues to gain weight.  She does note exercise has been less these past couple of months.  As weather improves, she plans to exercise more. she been on phentermine in the past which is very helpful for her.  She is interested in this medication for short term.  Insurance did not cover [employer]  She is no longer on amlodipine    Allergies: Patient has no known allergies. Current Outpatient Medications on File Prior to Visit  Medication Sig Dispense Refill   buPROPion (WELLBUTRIN XL) 300 MG 24 hr tablet Take 1 tablet (300 mg total) by mouth daily. 90 tablet 0   fluorouracil (EFUDEX) 5 % cream Apply topically 2 (two) times daily. Apply to affected areas at nose twice daily for 7 days 15 g 0   No current facility-administered medications on file  prior to visit.    Review of Systems  Constitutional:  Negative for chills and fever.  Respiratory:  Negative for cough and shortness of breath.   Cardiovascular:  Negative for chest pain, palpitations and leg swelling.  Gastrointestinal:  Negative for nausea and vomiting.      Objective:    BP 120/70   Pulse 73   Temp 97.9 F (36.6 C) (Oral)   Ht '5\' 4"'$  (1.626 m)   Wt 193 lb 6.4 oz (87.7 kg)   LMP 09/09/2015 (Approximate)   SpO2 99%   BMI 33.20 kg/m  BP Readings from Last 3 Encounters:  02/23/23 120/70  05/19/22 118/62  05/28/21 128/86   Wt Readings from Last 3 Encounters:  02/23/23 193 lb 6.4 oz (87.7 kg)  05/19/22 173 lb 12.8 oz (78.8 kg)  05/28/21 180 lb (81.6 kg)    Physical Exam Vitals reviewed.  Constitutional:      Appearance: She is well-developed.  Eyes:     Conjunctiva/sclera: Conjunctivae normal.  Cardiovascular:     Rate and Rhythm: Normal rate and regular rhythm.     Pulses: Normal pulses.     Heart sounds: Normal heart sounds.  Pulmonary:     Effort: Pulmonary effort is normal.     Breath sounds: Normal breath sounds. No wheezing, rhonchi or rales.  Skin:    General: Skin is warm and dry.  Neurological:  Mental Status: She is alert.  Psychiatric:        Speech: Speech normal.        Behavior: Behavior normal.        Thought Content: Thought content normal.

## 2023-02-23 NOTE — Assessment & Plan Note (Signed)
Patient will stop metformin.  Trial of phentermine half tablet and then patient will will take full tablet of 37.5 mg.  We discussed duration of 3 months, potentially  6 months if she is actively losing weight.  Patient counseled on side effects to stay vigilant for.

## 2023-02-23 NOTE — Patient Instructions (Signed)
You may stop taking metformin at this time.    Start phentermine.  I prescribed 37.5 mg however as discussed I want you to start by taking 1/2 tablet you may do this for several days or indefinitely.  Goal is to lose 1 to 2 pounds maximum per week.  Please stay incredibly vigilant as it relates to heart palpitations, elevations in blood pressure, increased anxiety or trouble sleeping.    Look forward to seeing you at your physical!  Phentermine Capsules or Tablets What is this medication? PHENTERMINE (FEN ter meen) promotes weight loss. It works by decreasing appetite. It is often used for a short period of time. Changes to diet and exercise are often combined with this medication. This medicine may be used for other purposes; ask your health care provider or pharmacist if you have questions. COMMON BRAND NAME(S): Adipex-P, Atti-Plex P, Atti-Plex P Spansule, Fastin, Lomaira, Pro-Fast, Pro-Fast HS, Pro-Fast SA, Tara-8 What should I tell my care team before I take this medication? They need to know if you have any of these conditions: Agitation or nervousness Diabetes Glaucoma Heart disease High blood pressure History of substance use disorder History of stroke Kidney disease Lung disease called Primary Pulmonary Hypertension (PPH) Taken an MAOI, such as Carbex, Eldepryl, Marplan, Nardil, or Parnate in last 14 days Taking stimulant medications for attention disorders, weight loss, or to stay awake Thyroid disease An unusual or allergic reaction to phentermine, other medications, foods, dyes, or preservatives Pregnant or trying to get pregnant Breastfeeding How should I use this medication? Take this medication by mouth with a glass of water. Follow the directions on the prescription label. Take your medication at regular intervals. Do not take it more often than directed. Do not stop taking except on your care team's advice. Talk to your care team about the use of this medication in  children. While this medication may be prescribed for children 17 years or older for selected conditions, precautions do apply. Overdosage: If you think you have taken too much of this medicine contact a poison control center or emergency room at once. NOTE: This medicine is only for you. Do not share this medicine with others. What if I miss a dose? If you miss a dose, take it as soon as you can. If it is almost time for your next dose, take only that dose. Do not take double or extra doses. What may interact with this medication? Do not take this medication with any of the following: MAOIs, such as Carbex, Eldepryl, Marplan, Nardil, and Parnate This medication may also interact with the following: Alcohol Certain medications for depression, anxiety, or other mental health conditions Certain medications for blood pressure Linezolid Medications for colds or breathing difficulties, such as pseudoephedrine or phenylephrine Medications for diabetes Sibutramine Stimulant medications for ADHD, weight loss, or staying awake This list may not describe all possible interactions. Give your health care provider a list of all the medicines, herbs, non-prescription drugs, or dietary supplements you use. Also tell them if you smoke, drink alcohol, or use illegal drugs. Some items may interact with your medicine. What should I watch for while using this medication? Visit your care team for regular checks on your progress. Do not stop taking except on your care team's advice. You may develop a severe reaction. Your care team will tell you how much medication to take. Do not take this medication close to bedtime. It may prevent you from sleeping. This medication may affect your coordination, reaction time,  or judgment. Do not drive or operate machinery until you know how this medication affects you. Sit up or stand slowly to reduce the risk of dizzy or fainting spells. Drinking alcohol with this medication can  increase the risk of these side effects. This medication may affect blood sugar levels. Ask your care team if changes in diet or medications are needed if you have diabetes. Inform your care team if you wish to become pregnant or think you might be pregnant. Losing weight while pregnant is not advised and may cause harm to the unborn child. Talk to your care team for more information. What side effects may I notice from receiving this medication? Side effects that you should report to your care team as soon as possible: Allergic reactions--skin rash, itching, hives, swelling of the face, lips, tongue, or throat Heart valve disease--shortness of breath, chest pain, unusual weakness or fatigue, dizziness, feeling faint or lightheaded, fever, sudden weight gain, fast or irregular heartbeat Pulmonary hypertension--shortness of breath, chest pain, fast or irregular heartbeat, feeling faint or lightheaded, fatigue, swelling of the ankles or feet Side effects that usually do not require medical attention (report to your care team if they continue or are bothersome): Change in taste Diarrhea Dizziness Dry mouth Restlessness Trouble sleeping This list may not describe all possible side effects. Call your doctor for medical advice about side effects. You may report side effects to FDA at 1-800-FDA-1088. Where should I keep my medication? Keep out of the reach of children. This medication can be abused. Keep your medication in a safe place to protect it from theft. Do not share this medication with anyone. Selling or giving away this medication is dangerous and against the law. This medication may cause harm and death if it is taken by other adults, children, or pets. Return medication that has not been used to an official disposal site. Contact the DEA at (661)122-8227 or your city/county government to find a site. If you cannot return the medication, mix any unused medication with a substance like cat  litter or coffee grounds. Then throw the medication away in a sealed container like a sealed bag or coffee can with a lid. Do not use the medication after the expiration date. Store at room temperature between 20 and 25 degrees C (68 and 77 degrees F). Keep container tightly closed. NOTE: This sheet is a summary. It may not cover all possible information. If you have questions about this medicine, talk to your doctor, pharmacist, or health care provider.  2023 Elsevier/Gold Standard (2008-01-20 00:00:00)

## 2023-03-10 ENCOUNTER — Encounter: Payer: Self-pay | Admitting: *Deleted

## 2023-03-23 ENCOUNTER — Ambulatory Visit (INDEPENDENT_AMBULATORY_CARE_PROVIDER_SITE_OTHER): Payer: 59 | Admitting: Dermatology

## 2023-03-23 ENCOUNTER — Encounter: Payer: Self-pay | Admitting: Dermatology

## 2023-03-23 VITALS — BP 166/93

## 2023-03-23 DIAGNOSIS — Z1283 Encounter for screening for malignant neoplasm of skin: Secondary | ICD-10-CM | POA: Diagnosis not present

## 2023-03-23 DIAGNOSIS — D229 Melanocytic nevi, unspecified: Secondary | ICD-10-CM

## 2023-03-23 DIAGNOSIS — L821 Other seborrheic keratosis: Secondary | ICD-10-CM | POA: Diagnosis not present

## 2023-03-23 DIAGNOSIS — L578 Other skin changes due to chronic exposure to nonionizing radiation: Secondary | ICD-10-CM

## 2023-03-23 DIAGNOSIS — L814 Other melanin hyperpigmentation: Secondary | ICD-10-CM

## 2023-03-23 DIAGNOSIS — Z85828 Personal history of other malignant neoplasm of skin: Secondary | ICD-10-CM | POA: Diagnosis not present

## 2023-03-23 DIAGNOSIS — Z8589 Personal history of malignant neoplasm of other organs and systems: Secondary | ICD-10-CM

## 2023-03-23 DIAGNOSIS — D1801 Hemangioma of skin and subcutaneous tissue: Secondary | ICD-10-CM

## 2023-03-23 NOTE — Progress Notes (Signed)
   Follow-Up Visit   Subjective  Kathleen Hull is a 57 y.o. female who presents for the following: Skin Cancer Screening and Full Body Skin Exam - History of SCC of chest  The patient presents for Total-Body Skin Exam (TBSE) for skin cancer screening and mole check. The patient has spots, moles and lesions to be evaluated, some may be new or changing and the patient has concerns that these could be cancer.    The following portions of the chart were reviewed this encounter and updated as appropriate: medications, allergies, medical history  Review of Systems:  No other skin or systemic complaints except as noted in HPI or Assessment and Plan.  Objective  Well appearing patient in no apparent distress; mood and affect are within normal limits.  A full examination was performed including scalp, head, eyes, ears, nose, lips, neck, chest, axillae, abdomen, back, buttocks, bilateral upper extremities, bilateral lower extremities, hands, feet, fingers, toes, fingernails, and toenails. All findings within normal limits unless otherwise noted below.   Relevant physical exam findings are noted in the Assessment and Plan.    Assessment & Plan   HISTORY OF SQUAMOUS CELL CARCINOMA OF THE SKIN - No evidence of recurrence today - No lymphadenopathy - Recommend regular full body skin exams - Recommend daily broad spectrum sunscreen SPF 30+ to sun-exposed areas, reapply every 2 hours as needed.  - Call if any new or changing lesions are noted between office visits   LENTIGINES, SEBORRHEIC KERATOSES, HEMANGIOMAS - Benign normal skin lesions - Benign-appearing - Call for any changes  MELANOCYTIC NEVI - Tan-brown and/or pink-flesh-colored symmetric macules and papules - Benign appearing on exam today - Observation - Call clinic for new or changing moles - Recommend daily use of broad spectrum spf 30+ sunscreen to sun-exposed areas.   ACTINIC DAMAGE - Chronic condition, secondary to  cumulative UV/sun exposure - diffuse scaly erythematous macules with underlying dyspigmentation - Recommend daily broad spectrum sunscreen SPF 30+ to sun-exposed areas, reapply every 2 hours as needed.  - Staying in the shade or wearing long sleeves, sun glasses (UVA+UVB protection) and wide brim hats (4-inch brim around the entire circumference of the hat) are also recommended for sun protection.  - Call for new or changing lesions.  SKIN CANCER SCREENING PERFORMED TODAY.     Return in about 1 year (around 03/22/2024) for TBSE.  I, Joanie Coddington, CMA, am acting as scribe for Armida Sans, MD .   Documentation: I have reviewed the above documentation for accuracy and completeness, and I agree with the above.  Armida Sans, MD

## 2023-03-23 NOTE — Patient Instructions (Signed)
Due to recent changes in healthcare laws, you may see results of your pathology and/or laboratory studies on MyChart before the doctors have had a chance to review them. We understand that in some cases there may be results that are confusing or concerning to you. Please understand that not all results are received at the same time and often the doctors may need to interpret multiple results in order to provide you with the best plan of care or course of treatment. Therefore, we ask that you please give us 2 business days to thoroughly review all your results before contacting the office for clarification. Should we see a critical lab result, you will be contacted sooner.   If You Need Anything After Your Visit  If you have any questions or concerns for your doctor, please call our main line at [phone] and press option 4 to reach your doctor's medical assistant. If no one answers, please leave a voicemail as directed and we will return your call as soon as possible. Messages left after 4 pm will be answered the following business day.   You may also send us a message via MyChart. We typically respond to MyChart messages within 1-2 business days.  For prescription refills, please ask your pharmacy to contact our office. Our fax number is [phone].  If you have an urgent issue when the clinic is closed that cannot wait until the next business day, you can page your doctor at the number below.    Please note that while we do our best to be available for urgent issues outside of office hours, we are not available 24/7.   If you have an urgent issue and are unable to reach us, you may choose to seek medical care at your doctor's office, retail clinic, urgent care center, or emergency room.  If you have a medical emergency, please immediately call 911 or go to the emergency department.  Pager Numbers  - Dr. Kowalski: [phone]  - Dr. Moye: [phone]  - Dr. Stewart:  [phone]  In the event of inclement weather, please call our main line at [phone] for an update on the status of any delays or closures.  Dermatology Medication Tips: Please keep the boxes that topical medications come in in order to help keep track of the instructions about where and how to use these. Pharmacies typically print the medication instructions only on the boxes and not directly on the medication tubes.   If your medication is too expensive, please contact our office at [phone] option 4 or send us a message through MyChart.   We are unable to tell what your co-pay for medications will be in advance as this is different depending on your insurance coverage. However, we may be able to find a substitute medication at lower cost or fill out paperwork to get insurance to cover a needed medication.   If a prior authorization is required to get your medication covered by your insurance company, please allow us 1-2 business days to complete this process.  Drug prices often vary depending on where the prescription is filled and some pharmacies may offer cheaper prices.  The website www.goodrx.com contains coupons for medications through different pharmacies. The prices here do not account for what the cost may be with help from insurance (it may be cheaper with your insurance), but the website can give you the price if you did not use any insurance.  - You can print the associated coupon and take it with   your prescription to the pharmacy.  - You may also stop by our office during regular business hours and pick up a GoodRx coupon card.  - If you need your prescription sent electronically to a different pharmacy, notify our office through [facility] MyChart or by phone at [phone] option 4.     Si Usted Necesita Algo Despus de Su Visita  Tambin puede enviarnos un mensaje a travs de MyChart. Por lo general respondemos a los mensajes de MyChart en el transcurso de 1 a 2  das hbiles.  Para renovar recetas, por favor pida a su farmacia que se ponga en contacto con nuestra oficina. Nuestro nmero de fax es el [phone].  Si tiene un asunto urgente cuando la clnica est cerrada y que no puede esperar hasta el siguiente da hbil, puede llamar/localizar a su doctor(a) al nmero que aparece a continuacin.   Por favor, tenga en cuenta que aunque hacemos todo lo posible para estar disponibles para asuntos urgentes fuera del horario de oficina, no estamos disponibles las 24 horas del da, los 7 das de la semana.   Si tiene un problema urgente y no puede comunicarse con nosotros, puede optar por buscar atencin mdica  en el consultorio de su doctor(a), en una clnica privada, en un centro de atencin urgente o en una sala de emergencias.  Si tiene una emergencia mdica, por favor llame inmediatamente al 911 o vaya a la sala de emergencias.  Nmeros de bper  - Dr. Kowalski: [phone]  - Dra. Moye: [phone]  - Dra. Stewart: [phone]  En caso de inclemencias del tiempo, por favor llame a nuestra lnea principal al [phone] para una actualizacin sobre el estado de cualquier retraso o cierre.  Consejos para la medicacin en dermatologa: Por favor, guarde las cajas en las que vienen los medicamentos de uso tpico para ayudarle a seguir las instrucciones sobre dnde y cmo usarlos. Las farmacias generalmente imprimen las instrucciones del medicamento slo en las cajas y no directamente en los tubos del medicamento.   Si su medicamento es muy caro, por favor, pngase en contacto con nuestra oficina llamando al [phone] y presione la opcin 4 o envenos un mensaje a travs de MyChart.   No podemos decirle cul ser su copago por los medicamentos por adelantado ya que esto es diferente dependiendo de la cobertura de su seguro. Sin embargo, es posible que podamos encontrar un medicamento sustituto a menor costo o llenar un formulario para que el  seguro cubra el medicamento que se considera necesario.   Si se requiere una autorizacin previa para que su compaa de seguros cubra su medicamento, por favor permtanos de 1 a 2 das hbiles para completar este proceso.  Los precios de los medicamentos varan con frecuencia dependiendo del lugar de dnde se surte la receta y alguna farmacias pueden ofrecer precios ms baratos.  El sitio web www.goodrx.com tiene cupones para medicamentos de diferentes farmacias. Los precios aqu no tienen en cuenta lo que podra costar con la ayuda del seguro (puede ser ms barato con su seguro), pero el sitio web puede darle el precio si no utiliz ningn seguro.  - Puede imprimir el cupn correspondiente y llevarlo con su receta a la farmacia.  - Tambin puede pasar por nuestra oficina durante el horario de atencin regular y recoger una tarjeta de cupones de GoodRx.  - Si necesita que su receta se enve electrnicamente a una farmacia diferente, informe a nuestra oficina a travs de MyChart de    o por telfono llamando al [phone] y presione la opcin 4.  

## 2023-04-06 ENCOUNTER — Encounter: Payer: Self-pay | Admitting: Dermatology

## 2023-04-11 ENCOUNTER — Other Ambulatory Visit: Payer: Self-pay | Admitting: Family

## 2023-04-13 MED ORDER — BUPROPION HCL ER (XL) 300 MG PO TB24: 300.0000 mg | ORAL_TABLET | Freq: Every day | ORAL | 3 refills | Status: DC

## 2023-05-03 ENCOUNTER — Telehealth (INDEPENDENT_AMBULATORY_CARE_PROVIDER_SITE_OTHER): Payer: Self-pay | Admitting: PULMONARY DISEASE

## 2023-05-03 ENCOUNTER — Ambulatory Visit: Payer: 59 | Attending: PULMONARY DISEASE | Admitting: PULMONARY DISEASE

## 2023-05-03 ENCOUNTER — Other Ambulatory Visit: Payer: Self-pay

## 2023-05-03 ENCOUNTER — Encounter (INDEPENDENT_AMBULATORY_CARE_PROVIDER_SITE_OTHER): Payer: Self-pay | Admitting: PULMONARY DISEASE

## 2023-05-03 VITALS — BP 122/80 | HR 78 | Resp 17 | Ht 62.0 in | Wt 178.0 lb

## 2023-05-03 DIAGNOSIS — F17210 Nicotine dependence, cigarettes, uncomplicated: Secondary | ICD-10-CM | POA: Insufficient documentation

## 2023-05-03 DIAGNOSIS — Z8673 Personal history of transient ischemic attack (TIA), and cerebral infarction without residual deficits: Secondary | ICD-10-CM | POA: Insufficient documentation

## 2023-05-03 DIAGNOSIS — R0602 Shortness of breath: Secondary | ICD-10-CM | POA: Insufficient documentation

## 2023-05-03 DIAGNOSIS — R5383 Other fatigue: Secondary | ICD-10-CM | POA: Insufficient documentation

## 2023-05-03 DIAGNOSIS — Z9989 Dependence on other enabling machines and devices: Secondary | ICD-10-CM

## 2023-05-03 DIAGNOSIS — R0683 Snoring: Secondary | ICD-10-CM | POA: Insufficient documentation

## 2023-05-03 DIAGNOSIS — R0600 Dyspnea, unspecified: Secondary | ICD-10-CM | POA: Insufficient documentation

## 2023-05-03 DIAGNOSIS — G4733 Obstructive sleep apnea (adult) (pediatric): Secondary | ICD-10-CM | POA: Insufficient documentation

## 2023-05-03 DIAGNOSIS — F17290 Nicotine dependence, other tobacco product, uncomplicated: Secondary | ICD-10-CM | POA: Insufficient documentation

## 2023-05-03 MED ORDER — SPIRIVA RESPIMAT 2.5 MCG/ACTUATION SOLUTION FOR INHALATION
2.0000 | Freq: Every day | RESPIRATORY_TRACT | 5 refills | Status: DC
Start: 2023-05-03 — End: 2024-04-09

## 2023-05-03 NOTE — Telephone Encounter (Addendum)
Could we please get old PSG from [facility] and order new CPAP.  DME is [facility].

## 2023-05-03 NOTE — H&P (Signed)
[facility]  [address]  [place] [address]  Operated by [facility]     New Patient    Patient Name: Andrea Summers  Date: 05/03/2023  Department:  [facility] [place]  MRN: [id number]  DOB: 1966/07/12  Primary Care Provider:  Ria Comment, PA  Referring Provider:  Ria Comment, PA      Chief Complaint:   Chief Complaint   Patient presents with    New Patient     Nursing Notes:   [name], [place]  05/03/23 1507  Signed  Neck Circumference:  13in   Epworth Score:  2     How often do you snore?  Every night   How often do you stop breathing?  Every night   Are you experiencing current symptoms of OSA? Gasping and Choking  Are you currently on CPAP/BIPAP? CPAP  DME Company Information: [facility]   Have you had a previous sleep study?  Yes   If yes, what type of sleep study was it?  How long ago was the sleep study?  3-4 years ago   Location of previous sleep study?  Pt cannot remember   Download available? No, Reason: no SD card. Cannot find online  Oxygen therapy?  No  DME Company Information: N/A  What Liter Flow is the patient on? N/A  Have you received a flu shot? No  Have you received a pneumonia shot? No  Smoking History:    Social History     Tobacco Use   Smoking Status Not on file   Smokeless Tobacco Not on file     Have you had any recent hospitalizations?  No  Has the patient had any tests performed since the last visit? None  If so, where were the test(s) performed? N/A  History of prior stroke?  Yes, 2007   History of Myocardial Infarction?  No  Suspicion of Period leg movement during sleep?  no  Leg pain?  No  When do you experience leg pain?  N/A  Does the patient have any other associated symptoms or modifying factors? Wheezing and Coughing    [name], MA           [name], [place]  05/03/23 1501  Signed     05/03/23 1400   Situation   Sitting and Reading 2   Watching TV 0   Sitting inactive in a public place.  0   As a passenger in a car for an hour without a break. 0   Lying down to rest in the afternoon when circumstances permit. 0   Sitting and Talking to someone 0   Sitting quietly after a lunch without alcohol 0   In a car, while stopped for a few minutes in traffic 0   Epworth Sleepiness Scale Score   Score total 2         History of Present Illness:   Andrea Summers is a 57 y.o., Unknown female presents today for OSA evaluation, referred here by Ria Comment PA.  Was diagnosed with OSA in 2017 through [facility]  Was diagnosed by Dr Lavell Anchors.  Got started on a CPAP.  Prior to the CPAP, had witnessed apneas and moderate constant fatigue and hypersomnia.  He machine is now very old, and is stopping in the middle of the night.  The machine is also giving an error that its motor is at the end of its life.  In the last two years, prior to having machine issues, had started snoring more.  Has noted increase in fatigue as well.        Does have some intermittent dyspnea, and was recently started on albuterol and has some dyspnea.  Currently on PRN albuterol HFA/nebs.  Helps some, but still having some daily symptoms.      No other associated symptoms or modifying factors.      Smoked about 1/2 ppd for about 20 yrs.  Quit smoking about 5 yrs ago, but still will smoke a few cigarettes a day intermittently.  Vapes daily.  Works as a Teaching laboratory technician at [employer].      Past Medical History  Past Medical History:   Diagnosis Date    Essential hypertension     Hyperlipidemia       Past Surgical History  Past Surgical History:   Procedure Laterality Date    BRAIN TUMOR EXCISION      CESAREAN SECTION      HX BREAST BIOPSY Bilateral     HX CHOLECYSTECTOMY      HX HYSTERECTOMY      HX TUBAL LIGATION       Medication List  Current Outpatient Medications   Medication Sig    albuterol sulfate (PROVENTIL) 2.5 mg /3 mL (0.083 %) Inhalation nebulizer solution INHALE 3 ML BY NEBULIZATION 4 TIMES A DAY AS NEEDED    amLODIPine (NORVASC) 5  mg Oral Tablet Take 1 Tablet (5 mg total) by mouth Once a day    DULoxetine (CYMBALTA DR) 60 mg Oral Capsule, Delayed Release(E.C.)     escitalopram oxalate (LEXAPRO) 10 mg Oral Tablet     ipratropium-albuterol 0.5 mg-3 mg(2.5 mg base)/3 mL Solution for Nebulization INHALE 3 ML BY NEBULIZATION 4 TIMES A DAY AS NEEDED    metoprolol succinate (TOPROL-XL) 50 mg Oral Tablet Sustained Release 24 hr TAKE 1 TABLET BY MOUTH EVERY DAY DO NOT CRUSH OR CHEW    naproxen (NAPROSYN) 500 mg Oral Tablet Take 1 Tablet (500 mg total) by mouth Twice per day as needed    oxymetazoline HCl (SINEX LONG-ACTING NASL) Administer into affected nostril(s)    rOPINIRole (REQUIP) 1 mg Oral Tablet Take 1 Tablet (1 mg total) by mouth Every night    rosuvastatin (CRESTOR) 20 mg Oral Tablet Take 1 Tablet (20 mg total) by mouth Once a day    topiramate (TOPAMAX) 50 mg Oral Tablet Take 1 Tablet (50 mg total) by mouth Every night    torsemide (DEMADEX) 5 mg Oral Tablet Take 1 Tablet (5 mg total) by mouth Once a day    traZODone (DESYREL) 100 mg Oral Tablet Take 1 Tablet (100 mg total) by mouth Once a day    venlafaxine (EFFEXOR XR) 75 mg Oral Capsule, Sust. Release 24 hr TAKE 1 CAPSULE ER 24HR ORAL DAILY FOR 30 DAYS.     Allergy List  Allergy History as of 05/03/23        No Allergies on File                  Family History   Family Medical History:    None         Social History  Social History     Socioeconomic History    Marital status: Married   Tobacco Use    Smoking status: Never    Smokeless tobacco: Never   Vaping Use    Vaping status: Every Day  ROS negative other than above listed in HPI.    Vital Signs  Vitals:    05/03/23 1505   BP: 122/80   Pulse: 78   Resp: 17   SpO2: 98%   Weight: 80.7 kg (178 lb)   Height: 1.575 m (5\' 2" )   BMI: 32.62        PHYSICAL EXAMINATION:   Constitutional:  Vital signs stable.  General appearance of the patient:  Alert, no acute distress.  Normal appearance, well nourished.  Ears, Nose, Mouth, and Throat:  External inspection of ears and nose with normal appearance.  Inspection of lips, teeth and gums with normal appearance and oral mucosa normal.  Neck: Supple with trachea midline, non tender, no nodules, no masses, gland position midline.  Respiratory:  Auscultation of lungs with normal breath sounds, no rales, no rhonchi, no wheezing.  Respiratory effort with no tractions, breathing regular and unlabored.  Cardiovascular:  Regular rhythm and regular rate.  No murmur, no peripheral edema.  Gastrointestinal: Abdomen non-tender, no masses, no hepatomegaly present.  Musculoskeletal:  Normal gait and station, normal digits, no digital cyanosis or clubbing.  Mental Status/Psychiatric:  Alert, grossly oriented to person, place, and time.  Appropriate and normal mood.    Assessment    ICD-10-CM    1. OSA (obstructive sleep apnea)  G47.33       2. Fatigue, unspecified type  R53.83       3. Cigarette nicotine dependence  F17.210       4. Shortness of breath  R06.02       5. Snoring  R06.83                Plan    Will get old sleep study, order new CPAP, and get a titration study.      Will start on Spiriva and continue PRN albuterol.  Dyspnea has worsened, and will FU with CXR and PFT's.    Return in about 21 weeks (around 09/27/2023) for with PFT and CXR with midlevel.    Instructions  Continue medications as prescribed/directed unless changed by provider.  Plan of care discussed with patient.      The patient was given the opportunity to ask questions and those questions were answered to the patient's satisfaction. The patient was encouraged to call with any additional questions or concerns. Discussed with the patient effects and side effects of medications. Medication safety was discussed.  The patient was informed to contact the office within 7 business days if a message/lab results/referral/imaging results have not been conveyed to the patient.    Electronically signed by Carmie End, MD  Pulmonary and  Critical care    This note may have been partially generated using MModal Fluency Direct system, and there may be some incorrect words, spellings, and punctuation that were not noted in checking the note before saving.

## 2023-05-03 NOTE — Nursing Note (Signed)
Neck Circumference:  13in   Epworth Score:  2     How often do you snore?  Every night   How often do you stop breathing?  Every night   Are you experiencing current symptoms of OSA? Gasping and Choking  Are you currently on CPAP/BIPAP? CPAP  DME Company Information: [facility]   Have you had a previous sleep study?  Yes   If yes, what type of sleep study was it?  How long ago was the sleep study?  3-4 years ago   Location of previous sleep study?  Pt cannot remember   Download available? No, Reason: no SD card. Cannot find online  Oxygen therapy?  No  DME Company Information: N/A  What Liter Flow is the patient on? N/A  Have you received a flu shot? No  Have you received a pneumonia shot? No  Smoking History:    Social History     Tobacco Use   Smoking Status Not on file   Smokeless Tobacco Not on file     Have you had any recent hospitalizations?  No  Has the patient had any tests performed since the last visit? None  If so, where were the test(s) performed? N/A  History of prior stroke?  Yes, 2007   History of Myocardial Infarction?  No  Suspicion of Period leg movement during sleep?  no  Leg pain?  No  When do you experience leg pain?  N/A  Does the patient have any other associated symptoms or modifying factors? Wheezing and Coughing    [name], [place]

## 2023-05-03 NOTE — Nursing Note (Signed)
05/03/23 1400   Situation   Sitting and Reading 2   Watching TV 0   Sitting inactive in a public place. 0   As a passenger in a car for an hour without a break. 0   Lying down to rest in the afternoon when circumstances permit. 0   Sitting and Talking to someone 0   Sitting quietly after a lunch without alcohol 0   In a car, while stopped for a few minutes in traffic 0   Epworth Sleepiness Scale Score   Score total 2

## 2023-05-24 ENCOUNTER — Encounter: Payer: Self-pay | Admitting: Family

## 2023-05-24 ENCOUNTER — Ambulatory Visit (INDEPENDENT_AMBULATORY_CARE_PROVIDER_SITE_OTHER): Payer: 59 | Admitting: Family

## 2023-05-24 ENCOUNTER — Other Ambulatory Visit (HOSPITAL_COMMUNITY)
Admission: RE | Admit: 2023-05-24 | Discharge: 2023-05-24 | Disposition: A | Discharge status: Home / Self Care | Payer: 59 | Source: Ambulatory Visit | Attending: Family | Admitting: Family

## 2023-05-24 VITALS — BP 120/90 | HR 76 | Temp 97.6°F | Ht 64.0 in | Wt 171.6 lb

## 2023-05-24 DIAGNOSIS — Z124 Encounter for screening for malignant neoplasm of cervix: Secondary | ICD-10-CM | POA: Insufficient documentation

## 2023-05-24 DIAGNOSIS — E669 Obesity, unspecified: Secondary | ICD-10-CM

## 2023-05-24 DIAGNOSIS — Z1231 Encounter for screening mammogram for malignant neoplasm of breast: Secondary | ICD-10-CM

## 2023-05-24 DIAGNOSIS — Z1211 Encounter for screening for malignant neoplasm of colon: Secondary | ICD-10-CM

## 2023-05-24 DIAGNOSIS — Z13220 Encounter for screening for lipoid disorders: Secondary | ICD-10-CM

## 2023-05-24 DIAGNOSIS — Z136 Encounter for screening for cardiovascular disorders: Secondary | ICD-10-CM | POA: Diagnosis not present

## 2023-05-24 DIAGNOSIS — I10 Essential (primary) hypertension: Secondary | ICD-10-CM | POA: Diagnosis not present

## 2023-05-24 DIAGNOSIS — R899 Unspecified abnormal finding in specimens from other organs, systems and tissues: Secondary | ICD-10-CM

## 2023-05-24 DIAGNOSIS — Z8639 Personal history of other endocrine, nutritional and metabolic disease: Secondary | ICD-10-CM | POA: Diagnosis not present

## 2023-05-24 DIAGNOSIS — Z0000 Encounter for general adult medical examination without abnormal findings: Secondary | ICD-10-CM

## 2023-05-24 LAB — LIPID PANEL
Cholesterol: 178 mg/dL (ref 0–200)
HDL: 59.5 mg/dL (ref >39.00)
LDL Cholesterol: 99 mg/dL (ref 0–99)
NonHDL: 118.39
Total CHOL/HDL Ratio: 3
Triglycerides: 98 mg/dL (ref 0.0–149.0)
VLDL: 19.6 mg/dL (ref 0.0–40.0)

## 2023-05-24 LAB — CBC WITH DIFFERENTIAL/PLATELET
Basophils Absolute: 0 10*3/uL (ref 0.0–0.1)
Basophils Relative: 0.6 % (ref 0.0–3.0)
Eosinophils Absolute: 0.1 10*3/uL (ref 0.0–0.7)
Eosinophils Relative: 2.2 % (ref 0.0–5.0)
HCT: 39.7 % (ref 36.0–46.0)
Hemoglobin: 13.3 g/dL (ref 12.0–15.0)
Lymphocytes Relative: 32.5 % (ref 12.0–46.0)
Lymphs Abs: 1.9 10*3/uL (ref 0.7–4.0)
MCHC: 33.5 g/dL (ref 30.0–36.0)
MCV: 97.3 fl (ref 78.0–100.0)
Monocytes Absolute: 0.3 10*3/uL (ref 0.1–1.0)
Monocytes Relative: 5.4 % (ref 3.0–12.0)
Neutro Abs: 3.5 10*3/uL (ref 1.4–7.7)
Neutrophils Relative %: 59.3 % (ref 43.0–77.0)
Platelets: 311 10*3/uL (ref 150.0–400.0)
RBC: 4.09 Mil/uL (ref 3.87–5.11)
RDW: 13.1 % (ref 11.5–15.5)
WBC: 6 10*3/uL (ref 4.0–10.5)

## 2023-05-24 LAB — HEMOGLOBIN A1C: Hgb A1c MFr Bld: 4.8 % (ref 4.6–6.5)

## 2023-05-24 LAB — COMPREHENSIVE METABOLIC PANEL
ALT: 13 U/L (ref 0–35)
AST: 17 U/L (ref 0–37)
Albumin: 4.6 g/dL (ref 3.5–5.2)
Alkaline Phosphatase: 75 U/L (ref 39–117)
BUN: 17 mg/dL (ref 6–23)
CO2: 28 mEq/L (ref 19–32)
Calcium: 9.3 mg/dL (ref 8.4–10.5)
Chloride: 105 mEq/L (ref 96–112)
Creatinine, Ser: 1.11 mg/dL (ref 0.40–1.20)
GFR: 56.35 mL/min — ABNORMAL LOW (ref >60.00)
Glucose, Bld: 87 mg/dL (ref 70–99)
Potassium: 4.1 mEq/L (ref 3.5–5.1)
Sodium: 139 mEq/L (ref 135–145)
Total Bilirubin: 0.6 mg/dL (ref 0.2–1.2)
Total Protein: 7.4 g/dL (ref 6.0–8.3)

## 2023-05-24 LAB — TSH: TSH: 4.35 u[IU]/mL (ref 0.35–5.50)

## 2023-05-24 LAB — VITAMIN D 25 HYDROXY (VIT D DEFICIENCY, FRACTURES): VITD: 71.38 ng/mL (ref 30.00–100.00)

## 2023-05-24 NOTE — Assessment & Plan Note (Signed)
Clinical breast exam performed.  Patient will schedule mammogram.  Pap smear performed.  Encouraged continued walking

## 2023-05-24 NOTE — Patient Instructions (Addendum)
Monitor blood pressure at home and me 5-6 reading on separate days. Goal is less than 120/80, based on newest guidelines, however we certainly want to be less than 130/80;  if persistently higher, please make sooner follow up appointment so we can recheck you blood pressure and manage/ adjust medications.  Please call  and schedule your 3D mammogram as we discussed.  Health Maintenance for Postmenopausal Women Menopause is a normal process in which your ability to get pregnant comes to an end. This process happens slowly over many months or years, usually between the ages of 61 and 61. Menopause is complete when you have missed your menstrual period for 12 months. It is important to talk with your health care provider about some of the most common conditions that affect women after menopause (postmenopausal women). These include heart disease, cancer, and bone loss (osteoporosis). Adopting a healthy lifestyle and getting preventive care can help to promote your health and wellness. The actions you take can also lower your chances of developing some of these common conditions. What are the signs and symptoms of menopause? During menopause, you may have the following symptoms: Hot flashes. These can be moderate or severe. Night sweats. Decrease in sex drive. Mood swings. Headaches. Tiredness (fatigue). Irritability. Memory problems. Problems falling asleep or staying asleep. Talk with your health care provider about treatment options for your symptoms. Do I need hormone replacement therapy? Hormone replacement therapy is effective in treating symptoms that are caused by menopause, such as hot flashes and night sweats. Hormone replacement carries certain risks, especially as you become older. If you are thinking about using estrogen or estrogen with progestin, discuss the benefits and risks with your health care provider. How can I reduce my risk for heart disease and stroke? The risk of  heart disease, heart attack, and stroke increases as you age. One of the causes may be a change in the body's hormones during menopause. This can affect how your body uses dietary fats, triglycerides, and cholesterol. Heart attack and stroke are medical emergencies. There are many things that you can do to help prevent heart disease and stroke. Watch your blood pressure High blood pressure causes heart disease and increases the risk of stroke. This is more likely to develop in people who have high blood pressure readings or are overweight. Have your blood pressure checked: Every 3-5 years if you are 57-29 years of age. Every year if you are 30 years old or older. Eat a healthy diet  Eat a diet that includes plenty of vegetables, fruits, low-fat dairy products, and lean protein. Do not eat a lot of foods that are high in solid fats, added sugars, or sodium. Get regular exercise Get regular exercise. This is one of the most important things you can do for your health. Most adults should: Try to exercise for at least 150 minutes each week. The exercise should increase your heart rate and make you sweat (moderate-intensity exercise). Try to do strengthening exercises at least twice each week. Do these in addition to the moderate-intensity exercise. Spend less time sitting. Even light physical activity can be beneficial. Other tips Work with your health care provider to achieve or maintain a healthy weight. Do not use any products that contain nicotine or tobacco. These products include cigarettes, chewing tobacco, and vaping devices, such as e-cigarettes. If you need help quitting, ask your health care provider. Know your numbers. Ask your health care provider to check your cholesterol and your blood sugar (  glucose). Continue to have your blood tested as directed by your health care provider. Do I need screening for cancer? Depending on your health history and family history, you may need to have  cancer screenings at different stages of your life. This may include screening for: Breast cancer. Cervical cancer. Lung cancer. Colorectal cancer. What is my risk for osteoporosis? After menopause, you may be at increased risk for osteoporosis. Osteoporosis is a condition in which bone destruction happens more quickly than new bone creation. To help prevent osteoporosis or the bone fractures that can happen because of osteoporosis, you may take the following actions: If you are 36-12 years old, get at least 1,000 mg of calcium and at least 600 international units (IU) of vitamin D per day. If you are older than age 40 but younger than age 49, get at least 1,200 mg of calcium and at least 600 international units (IU) of vitamin D per day. If you are older than age 40, get at least 1,200 mg of calcium and at least 800 international units (IU) of vitamin D per day. Smoking and drinking excessive alcohol increase the risk of osteoporosis. Eat foods that are rich in calcium and vitamin D, and do weight-bearing exercises several times each week as directed by your health care provider. How does menopause affect my mental health? Depression may occur at any age, but it is more common as you become older. Common symptoms of depression include: Feeling depressed. Changes in sleep patterns. Changes in appetite or eating patterns. Feeling an overall lack of motivation or enjoyment of activities that you previously enjoyed. Frequent crying spells. Talk with your health care provider if you think that you are experiencing any of these symptoms. General instructions See your health care provider for regular wellness exams and vaccines. This may include: Scheduling regular health, dental, and eye exams. Getting and maintaining your vaccines. These include: Influenza vaccine. Get this vaccine each year before the flu season begins. Pneumonia vaccine. Shingles vaccine. Tetanus, diphtheria, and pertussis  (Tdap) booster vaccine. Your health care provider may also recommend other immunizations. Tell your health care provider if you have ever been abused or do not feel safe at home. Summary Menopause is a normal process in which your ability to get pregnant comes to an end. This condition causes hot flashes, night sweats, decreased interest in sex, mood swings, headaches, or lack of sleep. Treatment for this condition may include hormone replacement therapy. Take actions to keep yourself healthy, including exercising regularly, eating a healthy diet, watching your weight, and checking your blood pressure and blood sugar levels. Get screened for cancer and depression. Make sure that you are up to date with all your vaccines. This information is not intended to replace advice given to you by your health care provider. Make sure you discuss any questions you have with your health care provider. Document Revised: 04/20/2021 Document Reviewed: 04/20/2021 Elsevier Patient Education  2024 [employer].

## 2023-05-24 NOTE — Assessment & Plan Note (Signed)
She has lost 22 pounds, tolerating phentermine quite well.  Currently taking half tablet of phentermine.  In the absence of side of side effects , patient will continue until 3 month prescription has been used .

## 2023-05-24 NOTE — Progress Notes (Signed)
Assessment & Plan:  Hypertension, unspecified type Assessment & Plan: Blood pressure slightly elevated, improved when checked again.  Patient previously on amlodipine, tolerated well.  We agreed to keep blood pressure log at home and she will call me persistently greater than 120/80.    Orders: -     Comprehensive metabolic panel -     TSH  Encounter for lipid screening for cardiovascular disease -     Lipid panel  Screening for cervical cancer -     Cytology - PAP  History of vitamin D deficiency -     VITAMIN D 25 Hydroxy (Vit-D Deficiency, Fractures)  Obesity (BMI 30.0-34.9) Assessment & Plan: She has lost 22 pounds, tolerating phentermine quite well.  Currently taking half tablet of phentermine.  In the absence of side of side effects , patient will continue until 3 month prescription has been used .  Orders: -     Hemoglobin A1c  Abnormal laboratory test -     CBC with Differential/Platelet  Screen for colon cancer -     Ambulatory referral to Gastroenterology  Encounter for screening mammogram for malignant neoplasm of breast -     3D Screening Mammogram, Left and Right; Future  Routine general medical examination at a health care facility Assessment & Plan: Clinical breast exam performed.  Patient will schedule mammogram.  Pap smear performed.  Encouraged continued walking      Return precautions given.   Risks, benefits, and alternatives of the medications and treatment plan prescribed today were discussed, and patient expressed understanding.   Education regarding symptom management and diagnosis given to patient on AVS either electronically or printed.  No follow-ups on file.  Rennie Plowman, FNP  Subjective:    Patient ID: Kathleen Hull, female    DOB: [birth year], 57 y.o.   MRN: [id number]  CC: Kathleen Hull is a 57 y.o. female who presents today for physical exam.    HPI: Feels well today.  No new complaints   H/o SCC.  She follows  dermatology annually No family h/o colon cancer   Started phentermine 3 months ago.  She is quite pleased with medication and weight loss.  Denies anxiety, CP, palpitation, trouble sleeping  Colorectal Cancer Screening: due Breast Cancer Screening: Mammogram UTD Cervical Cancer Screening: due;  NIL, negative HPV 08/08/20 Bone Health screening/DEXA for 65+: No increased fracture risk. Defer screening at this time.  Lung Cancer Screening: Doesn't have 20 year pack year history and age > 12 years yo 31 years       Tetanus - UTD         Exercise: Gets regular exercise. Walking daily.  Alcohol use:  none Smoking/tobacco use: Nonsmoker.    Health Maintenance  Topic Date Due   Colon Cancer Screening  Never done   COVID-19 Vaccine (2 - Janssen risk series) 03/21/2020   Zoster (Shingles) Vaccine (1 of 2) 05/26/2023*   Flu Shot  07/14/2023   Pap Smear  08/09/2023   Mammogram  07/21/2024   DTaP/Tdap/Td vaccine (3 - Td or Tdap) 05/19/2032   HIV Screening  Completed   HPV Vaccine  Aged Out   Hepatitis C Screening  Discontinued  *Topic was postponed. The date shown is not the original due date.    ALLERGIES: Patient has no known allergies.  Current Outpatient Medications on File Prior to Visit  Medication Sig Dispense Refill   buPROPion (WELLBUTRIN XL) 300 MG 24 hr tablet Take 1 tablet (300 mg total)  by mouth daily. 90 tablet 3   phentermine (ADIPEX-P) 37.5 MG tablet Take 1 tablet (37.5 mg total) by mouth daily before breakfast. 30 tablet 2   No current facility-administered medications on file prior to visit.    Review of Systems  Constitutional:  Negative for chills, fever and unexpected weight change.  HENT:  Negative for congestion.   Respiratory:  Negative for cough.   Cardiovascular:  Negative for chest pain, palpitations and leg swelling.  Gastrointestinal:  Negative for nausea and vomiting.  Genitourinary:  Negative for pelvic pain.  Musculoskeletal:  Negative for  arthralgias and myalgias.  Skin:  Negative for rash.  Neurological:  Negative for headaches.  Hematological:  Negative for adenopathy.  Psychiatric/Behavioral:  Negative for confusion.       Objective:    BP (!) 120/90   Pulse 76   Temp 97.6 F (36.4 C) (Oral)   Ht 5\' 4"  (1.626 m)   Wt 171 lb 9.6 oz (77.8 kg)   LMP 09/09/2015 (Approximate)   SpO2 98%   BMI 29.46 kg/m   BP Readings from Last 3 Encounters:  05/24/23 (!) 120/90  03/23/23 (!) 166/93  02/23/23 120/70   Wt Readings from Last 3 Encounters:  05/24/23 171 lb 9.6 oz (77.8 kg)  02/23/23 193 lb 6.4 oz (87.7 kg)  05/19/22 173 lb 12.8 oz (78.8 kg)    Physical Exam Vitals reviewed.  Constitutional:      Appearance: Normal appearance. She is well-developed.  Eyes:     Conjunctiva/sclera: Conjunctivae normal.  Neck:     Thyroid: No thyroid mass or thyromegaly.  Cardiovascular:     Rate and Rhythm: Normal rate and regular rhythm.     Pulses: Normal pulses.     Heart sounds: Normal heart sounds.  Pulmonary:     Effort: Pulmonary effort is normal.     Breath sounds: Normal breath sounds. No wheezing, rhonchi or rales.  Chest:  Breasts:    Breasts are symmetrical.     Right: No inverted nipple, mass, nipple discharge, skin change or tenderness.     Left: No inverted nipple, mass, nipple discharge, skin change or tenderness.  Abdominal:     General: Bowel sounds are normal. There is no distension.     Palpations: Abdomen is soft. Abdomen is not rigid. There is no fluid wave or mass.     Tenderness: There is no abdominal tenderness. There is no guarding or rebound.  Genitourinary:    Cervix: No cervical motion tenderness, discharge or friability.     Uterus: Not enlarged, not fixed and not tender.      Adnexa:        Right: No mass, tenderness or fullness.         Left: No mass, tenderness or fullness.       Comments: Pap performed. No CMT. Unable to appreciated ovaries. Lymphadenopathy:     Head:     Right  side of head: No submental, submandibular, tonsillar, preauricular, posterior auricular or occipital adenopathy.     Left side of head: No submental, submandibular, tonsillar, preauricular, posterior auricular or occipital adenopathy.     Cervical:     Right cervical: No superficial, deep or posterior cervical adenopathy.    Left cervical: No superficial, deep or posterior cervical adenopathy.     Upper Body:     Right upper body: No pectoral adenopathy.     Left upper body: No pectoral adenopathy.  Skin:    General: Skin is  warm and dry.  Neurological:     Mental Status: She is alert.  Psychiatric:        Speech: Speech normal.        Behavior: Behavior normal.        Thought Content: Thought content normal.

## 2023-05-24 NOTE — Assessment & Plan Note (Addendum)
Blood pressure slightly elevated, improved when checked again.  Patient previously on amlodipine, tolerated well.  We agreed to keep blood pressure log at home and she will call me persistently greater than 120/80.

## 2023-05-27 ENCOUNTER — Other Ambulatory Visit: Payer: Self-pay

## 2023-05-27 ENCOUNTER — Telehealth: Payer: Self-pay

## 2023-05-27 DIAGNOSIS — Z1211 Encounter for screening for malignant neoplasm of colon: Secondary | ICD-10-CM

## 2023-05-27 LAB — CYTOLOGY - PAP
Comment: NEGATIVE
Diagnosis: NEGATIVE
High risk HPV: NEGATIVE

## 2023-05-27 MED ORDER — NA SULFATE-K SULFATE-MG SULF 17.5-3.13-1.6 GM/177ML PO SOLN: 1.0000 | Freq: Once | ORAL | 0 refills | Status: AC

## 2023-05-27 NOTE — Telephone Encounter (Signed)
Gastroenterology Pre-Procedure Review  Request Date: 07/18/23 Requesting Physician: Dr. Tobi Bastos  PATIENT REVIEW QUESTIONS: The patient responded to the following health history questions as indicated:   Pt takes Phentermine.  Has been advised to stop 1 day prior to her colonoscopy.     1. Are you having any GI issues? no 2. Do you have a personal history of Polyps? no 3. Do you have a family history of Colon Cancer or Polyps? no 4. Diabetes Mellitus? no 5. Joint replacements in the past 12 months?no 6. Major health problems in the past 3 months?no 7. Any artificial heart valves, MVP, or defibrillator?no    MEDICATIONS & ALLERGIES:    Patient reports the following regarding taking any anticoagulation/antiplatelet therapy:   Plavix, Coumadin, Eliquis, Xarelto, Lovenox, Pradaxa, Brilinta, or Effient? no Aspirin? no  Patient confirms/reports the following medications:  Current Outpatient Medications  Medication Sig Dispense Refill   buPROPion (WELLBUTRIN XL) 300 MG 24 hr tablet Take 1 tablet (300 mg total) by mouth daily. 90 tablet 3   phentermine (ADIPEX-P) 37.5 MG tablet Take 1 tablet (37.5 mg total) by mouth daily before breakfast. 30 tablet 2   No current facility-administered medications for this visit.    Patient confirms/reports the following allergies:  No Known Allergies  No orders of the defined types were placed in this encounter.   AUTHORIZATION INFORMATION Primary Insurance: 1D#: Group #:  Secondary Insurance: 1D#: Group #:  SCHEDULE INFORMATION: Date: 07/18/23 Time: Location: [facility]

## 2023-07-10 ENCOUNTER — Inpatient Hospital Stay
Admission: RE | Admit: 2023-07-10 | Discharge: 2023-07-10 | Disposition: A | Discharge status: Home / Self Care | Payer: 59 | Source: Ambulatory Visit | Attending: PULMONARY DISEASE | Admitting: PULMONARY DISEASE

## 2023-07-10 ENCOUNTER — Other Ambulatory Visit: Payer: Self-pay

## 2023-07-10 DIAGNOSIS — R5383 Other fatigue: Secondary | ICD-10-CM | POA: Insufficient documentation

## 2023-07-10 DIAGNOSIS — G4733 Obstructive sleep apnea (adult) (pediatric): Secondary | ICD-10-CM | POA: Insufficient documentation

## 2023-07-10 DIAGNOSIS — R0683 Snoring: Secondary | ICD-10-CM

## 2023-07-11 ENCOUNTER — Encounter: Payer: Self-pay | Admitting: Gastroenterology

## 2023-07-11 NOTE — Procedures (Signed)
[facility]    Titration Study    Patient Name: Andrea Summers  Date of Birth: 02-26-1966  Gender: female  Provider: Emogene Morgan  Location: [facility]  Location Address: [address] [place], [place] [address] [facility]  Location Phone: [phone] [facility]      Primary Care Provider: Ria Comment, PA      Procedure Titration Study  Past Medical History  Past Medical History:   Diagnosis Date    Essential hypertension     Hyperlipidemia            Past Surgical History  Past Surgical History:   Procedure Laterality Date    BRAIN TUMOR EXCISION      CESAREAN SECTION      HX BREAST BIOPSY Bilateral     HX CHOLECYSTECTOMY      HX HYSTERECTOMY      HX TUBAL LIGATION             Medication LIst  No outpatient medications have been marked as taking for the 07/10/23 encounter [facility] Encounter) with PAC, SLEEP LAB.       Allergy List  Not on File    Family Medical History  Family Medical History:    None           Social History  Social History     Socioeconomic History    Marital status: Married   Tobacco Use    Smoking status: Never    Smokeless tobacco: Never   Vaping Use    Vaping status: Every Day       Patient Information  Sleep apnea symptoms: Snoring and Witnessed Apneas  Patient's Height: 62  Patient's Weight: 178  Patient's BMI: 32.6  Neck Circumference: 13  Epworth Scale Score: 14    Date of Study: 07/10/2023  Study Performed By: Edmonia Caprio CRT   Study Reviewed By: Ellsworth Lennox RRT RPSGT    Procedure : Procedure: Complete PSG titration with digital sleep system using the international 10-20 electrode placement for recording EEG, EOG, EMG from chin, ECG, Respiratory Effort, Oximetry, body position, airflow, snoring sound, pulse rate, limb movement channels, and with positive airway pressure.    Sleep Architecture: Total Recording Time: 494.5 min Total Sleep Time: 450.0 min. Sleep Efficiency Percentage: 91%. Patient Sleep Latency (Minutes): 32 min. Patient REM Latency  (Minutes): 269.5 min. Percentage of Stage I Sleep:  6.0 %. Percentage of Stage II Sleep: 76.2 %. Percentage of Stage III Sleep: 3.6 %. Percentage (%) of REM sleep: 14.2 %.   Respiratory Data: Number of Obstructive Apneas Observed: 0 . Number of Central Apneas Observed: 0. Number of Mixed Apneas Observed: 0. Total Number of Apneas: 0. Total number of Hypopneas: 0. Snoring During the Study: Yes, mild  Apnea Hypopnea Index (AHI) per hour: 0. Percent of Time Patient Spent in Supine Position: 48.51 %. AHI in Supine Position #: 0. Non-supine AHI #: 0. REM AHI #: 0. Non-REM AHI #:  0. Lowest Desaturation Percentage: 89%. Total Amount of Desaturated Time Below or Equal to 0 . Longest Apneic Event (In Seconds): 0 seconds.   Cardiac Data: The Mean Heart Rate During the Study (Beats/Min): 55. Sinus Rhythm: Normal   Periodic Leg Movements: Total Number of Periodic Leg Movements During the Study: 0. PLM Index #: 0  CPAP - BiPAP Titration Data : CPAP Yes Average pressure used to best reduce AHI was?: 9. At that pressure AHI was: 0. CPAP tolerated well.  Mask  used was: S/M Veterinary surgeon .  Humidity was utilized.   The study was scored by: 30 second EPOCH, 4 % desaturation and  CMS guidelines.AASM Accredited     Assessment:   Diagnosis:   (R06.83) Snoring (SCT) and (G47.33) OSA - Obstructive sleep apnea (SCT)    PLAN  Procedure Orders  CPAP 9 CMH2O    Instructions:   Patient advised to avoid sedatives, hypnotics, sedating antihistamines and alcohol until CPAP titration is completed and therapy initiated., Reviewed raw sleep data and agree with therapeutic directions outlined in report., The natural history of obstructive sleep apnea and its tendency to be associated with cardiac, pulmonary, neuropsychiatric discussed in detail with patient., The etiology of OSA was discussed in detail with patient, Driving while drowsy was discussed in detail, Reviews of trustworthy websites recommended, All benefits of CPAP/BiPAP discussed in detail,  Advised to call if any difficulties or questions regarding CPAP/BiPAP device, Positional Therapy may be helpful in snoring, and Nasal steroids may be helpful for primary snoring    Impression  Good Sleep Efficiency   CPAP 9 CMH2O  S/M [place]  Follow up within 45-90 days    Susa Loffler, CRT    Titration study showed that patient was titrated to CPAP pressure of 9.  We will need 45-90 day compliance download visit.  All raw data reviewed by me personally.    Dawna Part III, DO  07/15/2023 09:49

## 2023-07-15 ENCOUNTER — Other Ambulatory Visit (INDEPENDENT_AMBULATORY_CARE_PROVIDER_SITE_OTHER): Payer: Self-pay | Admitting: PULMONARY DISEASE

## 2023-07-15 DIAGNOSIS — G4733 Obstructive sleep apnea (adult) (pediatric): Secondary | ICD-10-CM

## 2023-07-18 ENCOUNTER — Encounter
Admission: RE | Disposition: A | Discharge status: Home / Self Care | Payer: Self-pay | Source: Home / Self Care | Attending: Gastroenterology

## 2023-07-18 ENCOUNTER — Ambulatory Visit: Payer: 59 | Admitting: Anesthesiology

## 2023-07-18 ENCOUNTER — Other Ambulatory Visit: Payer: Self-pay | Admitting: Family

## 2023-07-18 ENCOUNTER — Ambulatory Visit: Admission: RE | Admit: 2023-07-18 | Payer: 59 | Source: Home / Self Care | Admitting: Gastroenterology

## 2023-07-18 ENCOUNTER — Encounter (INDEPENDENT_AMBULATORY_CARE_PROVIDER_SITE_OTHER): Payer: Self-pay | Admitting: PULMONARY DISEASE

## 2023-07-18 DIAGNOSIS — Z1211 Encounter for screening for malignant neoplasm of colon: Secondary | ICD-10-CM | POA: Diagnosis not present

## 2023-07-18 DIAGNOSIS — Z8249 Family history of ischemic heart disease and other diseases of the circulatory system: Secondary | ICD-10-CM | POA: Insufficient documentation

## 2023-07-18 DIAGNOSIS — D126 Benign neoplasm of colon, unspecified: Secondary | ICD-10-CM | POA: Diagnosis not present

## 2023-07-18 DIAGNOSIS — D122 Benign neoplasm of ascending colon: Secondary | ICD-10-CM | POA: Diagnosis not present

## 2023-07-18 DIAGNOSIS — I10 Essential (primary) hypertension: Secondary | ICD-10-CM | POA: Insufficient documentation

## 2023-07-18 DIAGNOSIS — D128 Benign neoplasm of rectum: Secondary | ICD-10-CM | POA: Diagnosis not present

## 2023-07-18 DIAGNOSIS — E669 Obesity, unspecified: Secondary | ICD-10-CM

## 2023-07-18 HISTORY — PX: COLONOSCOPY WITH PROPOFOL: SHX5780

## 2023-07-18 HISTORY — PX: POLYPECTOMY: SHX5525

## 2023-07-18 SURGERY — COLONOSCOPY WITH PROPOFOL
Anesthesia: General

## 2023-07-18 MED ORDER — SODIUM CHLORIDE 0.9 % IV SOLN
INTRAVENOUS | Status: DC
  Administered 2023-07-18: 1000 mL via INTRAVENOUS

## 2023-07-18 MED ORDER — PROPOFOL 500 MG/50ML IV EMUL
INTRAVENOUS | Status: DC | PRN
  Administered 2023-07-18: 150 ug/kg/min via INTRAVENOUS
  Administered 2023-07-18: 50 mg via INTRAVENOUS

## 2023-07-18 MED ORDER — LIDOCAINE HCL (PF) 1 % IJ SOLN
INTRAMUSCULAR | Status: AC
  Filled 2023-07-18: qty 2

## 2023-07-18 MED ORDER — PROPOFOL 1000 MG/100ML IV EMUL
INTRAVENOUS | Status: AC
  Filled 2023-07-18: qty 100

## 2023-07-18 NOTE — Nursing Note (Signed)
Andrea Summers called and stated that they are not in [facility] with pts insurance, they stated that pt does not seem to have DME insurance. I called pt about this and asked the she call her insurance to find out and let us know so we can get her order sent out. She will call us back. Andrea Summers, RPSGT

## 2023-07-18 NOTE — Nursing Note (Signed)
Pt called back and asked that the CPAP order be sent to [facility]. Sent order there. Rease Wence, RPSGT

## 2023-07-18 NOTE — H&P (Signed)
Wyline Mood, MD [address], [address], [place], [place], [address] [address], [place], [place], [address] Phone: [phone]  Fax: [phone]  Primary Care Physician:  Allegra Grana, FNP   Pre-Procedure History & Physical: HPI:  ASPASIA FRIEDBERG is a 57 y.o. female is here for an colonoscopy.   Past Medical History:  Diagnosis Date   Actinic keratosis    Anxiety    Hypertension    Squamous cell skin cancer 2013    Past Surgical History:  Procedure Laterality Date   CHOLECYSTECTOMY     [place]   VAGINAL DELIVERY     3   WISDOM TOOTH EXTRACTION      Prior to Admission medications   Medication Sig Start Date End Date Taking? Authorizing Provider  Cholecalciferol (VITAMIN D) 50 MCG (2000 UT) tablet Take 2,000 Units by mouth daily.   Yes [provider]  Multiple Vitamin (MULTIVITAMIN) tablet Take 1 tablet by mouth daily.   Yes [provider]  buPROPion (WELLBUTRIN XL) 300 MG 24 hr tablet Take 1 tablet (300 mg total) by mouth daily. 04/13/23   Allegra Grana, FNP  phentermine (ADIPEX-P) 37.5 MG tablet Take 1 tablet (37.5 mg total) by mouth daily before breakfast. Patient not taking: Reported on 07/11/2023 02/23/23   Allegra Grana, FNP    Allergies as of 05/27/2023   (No Known Allergies)    Family History  Problem Relation Age of Onset   Thyroid disease Mother    Hypertension Mother    Diabetes Father    Hypertension Father    CAD Father    Breast cancer Neg Hx    Colon cancer Neg Hx    Thyroid cancer Neg Hx     Social History   Socioeconomic History   Marital status: Married    Spouse name: Not on file   Number of children: Not on file   Years of education: Not on file   Highest education level: Not on file  Occupational History   Not on file  Tobacco Use   Smoking status: Never   Smokeless tobacco: Never  Vaping Use   Vaping status: Never Used  Substance and Sexual Activity   Alcohol use: No    Drug use: No   Sexual activity: Not on file  Other Topics Concern   Not on file  Social History Narrative   Lives in [place] with husband and 3 children.      Work - works at [employer], Manufacturing systems engineer      Diet - Healthy, regular      Exercise - walking   Social Determinants of Corporate investment banker Strain: Not on [employer] Insecurity: Not on file  Transportation Needs: Not on file  Physical Activity: Not on file  Stress: Not on file  Social Connections: Not on file  Intimate Partner Violence: Not on file    Review of Systems: See HPI, otherwise negative ROS  Physical Exam: BP (!) 156/90   Pulse 66   Temp 97.9 F (36.6 C) (Temporal)   Resp 16   Ht 5\' 4"  (1.626 m)   Wt 76.4 kg   LMP 09/09/2015 (Approximate)   SpO2 100%   BMI 28.91 kg/m  General:   Alert,  pleasant and cooperative in NAD Head:  Normocephalic and atraumatic. Neck:  Supple; no masses or thyromegaly. Lungs:  Clear throughout to auscultation, normal respiratory effort.    Heart:  +S1, +S2,  Regular rate and rhythm, No edema. Abdomen:  Soft, nontender and nondistended. Normal bowel sounds, without guarding, and without rebound.   Neurologic:  Alert and  oriented x4;  grossly normal neurologically.  Impression/Plan: MERRIE MONTECINOS is here for an colonoscopy to be performed for Screening colonoscopy average risk   Risks, benefits, limitations, and alternatives regarding  colonoscopy have been reviewed with the patient.  Questions have been answered.  All parties agreeable.   Wyline Mood, MD  07/18/2023, 8:04 AM

## 2023-07-18 NOTE — Anesthesia Postprocedure Evaluation (Signed)
Anesthesia Post Note  Patient: Kathleen Hull  Procedure(s) Performed: COLONOSCOPY WITH PROPOFOL POLYPECTOMY  Patient location during evaluation: Endoscopy Anesthesia Type: General Level of consciousness: awake and alert Pain management: pain level controlled Vital Signs Assessment: post-procedure vital signs reviewed and stable Respiratory status: spontaneous breathing, nonlabored ventilation and respiratory function stable Cardiovascular status: blood pressure returned to baseline and stable Postop Assessment: no apparent nausea or vomiting Anesthetic complications: no   No notable events documented.   Last Vitals:  Vitals:   07/18/23 0826 07/18/23 0836  BP: 122/75 121/86  Pulse: 67   Resp: (!) 22   Temp: (!) 36.1 C   SpO2: 98%     Last Pain:  Vitals:   07/18/23 0846  TempSrc:   PainSc: 0-No pain                 Foye Deer

## 2023-07-18 NOTE — Transfer of Care (Signed)
Immediate Anesthesia Transfer of Care Note  Patient: Kathleen Hull  Procedure(s) Performed: COLONOSCOPY WITH PROPOFOL POLYPECTOMY  Patient Location: PACU  Anesthesia Type:General  Level of Consciousness: awake and alert   Airway & Oxygen Therapy: Patient Spontanous Breathing and Patient connected to nasal cannula oxygen  Post-op Assessment: Report given to RN and Post -op Vital signs reviewed and stable  Post vital signs: Reviewed and stable  Last Vitals:  Vitals Value Taken Time  BP 122/75 07/18/23 0826  Temp 36.1 C 07/18/23 0826  Pulse 67 07/18/23 0826  Resp 22 07/18/23 0826  SpO2 98 % 07/18/23 0826  Vitals shown include unfiled device data.  Last Pain:  Vitals:   07/18/23 0826  TempSrc: Temporal  PainSc: Asleep         Complications: No notable events documented.

## 2023-07-18 NOTE — Anesthesia Preprocedure Evaluation (Addendum)
Anesthesia Evaluation  Patient identified by MRN, date of birth, ID band Patient awake    Reviewed: Allergy & Precautions, H&P , NPO status , Patient's Chart, lab work & pertinent test results  Airway Mallampati: II  TM Distance: >3 FB Neck ROM: full    Dental no notable dental hx.    Pulmonary neg pulmonary ROS   Pulmonary exam normal        Cardiovascular hypertension, Normal cardiovascular exam     Neuro/Psych  PSYCHIATRIC DISORDERS Anxiety     negative neurological ROS     GI/Hepatic negative GI ROS, Neg liver ROS,,,  Endo/Other  negative endocrine ROS    Renal/GU negative Renal ROS  negative genitourinary   Musculoskeletal   Abdominal   Peds  Hematology negative hematology ROS (+)   Anesthesia Other Findings Past Medical History: No date: Actinic keratosis No date: Anxiety No date: Hypertension 2013: Squamous cell skin cancer  Past Surgical History: No date: CHOLECYSTECTOMY     Comment:  [place] No date: VAGINAL DELIVERY     Comment:  3 No date: WISDOM TOOTH EXTRACTION     Reproductive/Obstetrics negative OB ROS                             Anesthesia Physical Anesthesia Plan  ASA: 2  Anesthesia Plan: General   Post-op Pain Management:    Induction:   PONV Risk Score and Plan: Propofol infusion and TIVA  Airway Management Planned: Natural Airway  Additional Equipment:   Intra-op Plan:   Post-operative Plan:   Informed Consent: I have reviewed the patients History and Physical, chart, labs and discussed the procedure including the risks, benefits and alternatives for the proposed anesthesia with the patient or authorized representative who has indicated his/her understanding and acceptance.     Dental Advisory Given  Plan Discussed with: CRNA and Surgeon  Anesthesia Plan Comments:         Anesthesia Quick Evaluation

## 2023-07-18 NOTE — Op Note (Signed)
[facility] Patient Name: Kathleen Hull Procedure Date: 07/18/2023 7:20 AM MRN: [id number] Account #: [id number] Date of Birth: 1966-08-26 Admit Type: Outpatient Age: 57 Room: [facility] ENDO ROOM 1 Gender: Female Note Status: Finalized Instrument Name: Prentice Docker [id number] Procedure:             Colonoscopy Indications:           Screening for colorectal malignant neoplasm Providers:             Wyline Mood MD, MD Referring MD:          Lyn Records. Arnett (Referring MD) Medicines:             Monitored Anesthesia Care Complications:         No immediate complications. Procedure:             Pre-Anesthesia Assessment:                        - Prior to the procedure, a History and Physical was                         performed, and patient medications, allergies and                         sensitivities were reviewed. The patient's tolerance                         of previous anesthesia was reviewed.                        - The risks and benefits of the procedure and the                         sedation options and risks were discussed with the                         patient. All questions were answered and informed                         consent was obtained.                        - ASA Grade Assessment: II - A patient with mild                         systemic disease.                        After obtaining informed consent, the colonoscope was                         passed under direct vision. Throughout the procedure,                         the patient's blood pressure, pulse, and oxygen                         saturations were monitored continuously. The                         Colonoscope was introduced  through the anus and                         advanced to the the cecum, identified by the                         appendiceal orifice. The colonoscopy was performed                         with ease. The patient tolerated the procedure well.                          The quality of the bowel preparation was excellent.                         The ileocecal valve, appendiceal orifice, and rectum                         were photographed. Findings:      The perianal and digital rectal examinations were normal.      A 5 mm polyp was found in the ascending colon. The polyp was sessile.       The polyp was removed with a cold snare. Resection was complete, but the       polyp tissue was not retrieved.      An 8 mm polyp was found in the distal rectum. The polyp was       semi-pedunculated. The polyp was removed with a hot snare. Resection and       retrieval were complete.      The exam was otherwise without abnormality on direct and retroflexion       views. Impression:            - One 5 mm polyp in the ascending colon, removed with                         a cold snare. Complete resection. Polyp tissue not                         retrieved.                        - One 8 mm polyp in the distal rectum, removed with a                         hot snare. Resected and retrieved.                        - The examination was otherwise normal on direct and                         retroflexion views. Recommendation:        - Discharge patient to home (with escort).                        - Resume previous diet.                        - Continue present medications.                        -  Await pathology results.                        - Repeat colonoscopy for surveillance based on                         pathology results. Procedure Code(s):     --- Professional ---                        [phone], Colonoscopy, flexible; with removal of                         tumor(s), polyp(s), or other lesion(s) by snare                         technique Diagnosis Code(s):     --- Professional ---                        Z12.11, Encounter for screening for malignant neoplasm                         of colon                        D12.2, Benign neoplasm  of ascending colon                        D12.8, Benign neoplasm of rectum CPT copyright 2022 American Medical Association. All rights reserved. The codes documented in this report are preliminary and upon coder review may  be revised to meet current compliance requirements. Wyline Mood, MD Wyline Mood MD, MD 07/18/2023 8:24:04 AM This report has been signed electronically. Number of Addenda: 0 Note Initiated On: 07/18/2023 7:20 AM Scope Withdrawal Time: 0 hours 10 minutes 19 seconds  Total Procedure Duration: 0 hours 13 minutes 38 seconds  Estimated Blood Loss:  Estimated blood loss: none.      [facility]

## 2023-07-19 ENCOUNTER — Encounter: Payer: Self-pay | Admitting: Gastroenterology

## 2023-07-21 ENCOUNTER — Encounter: Payer: Self-pay | Admitting: Gastroenterology

## 2023-07-29 ENCOUNTER — Ambulatory Visit
Admission: RE | Admit: 2023-07-29 | Discharge: 2023-07-29 | Disposition: A | Discharge status: Home / Self Care | Payer: 59 | Source: Ambulatory Visit | Attending: Family | Admitting: Family

## 2023-07-29 DIAGNOSIS — Z1231 Encounter for screening mammogram for malignant neoplasm of breast: Secondary | ICD-10-CM

## 2023-09-27 ENCOUNTER — Encounter (INDEPENDENT_AMBULATORY_CARE_PROVIDER_SITE_OTHER): Payer: Self-pay | Admitting: NURSE PRACTITIONER

## 2023-09-27 ENCOUNTER — Ambulatory Visit (HOSPITAL_BASED_OUTPATIENT_CLINIC_OR_DEPARTMENT_OTHER)
Admission: RE | Admit: 2023-09-27 | Discharge: 2023-09-27 | Disposition: A | Discharge status: Home / Self Care | Payer: 59 | Source: Ambulatory Visit | Attending: PULMONARY DISEASE | Admitting: PULMONARY DISEASE

## 2023-09-27 ENCOUNTER — Other Ambulatory Visit: Payer: Self-pay

## 2023-09-27 ENCOUNTER — Ambulatory Visit: Payer: 59 | Attending: NURSE PRACTITIONER | Admitting: NURSE PRACTITIONER

## 2023-09-27 ENCOUNTER — Inpatient Hospital Stay (HOSPITAL_BASED_OUTPATIENT_CLINIC_OR_DEPARTMENT_OTHER)
Admission: RE | Admit: 2023-09-27 | Discharge: 2023-09-27 | Disposition: A | Discharge status: Home / Self Care | Payer: 59 | Source: Ambulatory Visit | Attending: NURSE PRACTITIONER | Admitting: NURSE PRACTITIONER

## 2023-09-27 VITALS — BP 124/78 | HR 66 | Resp 16 | Ht 62.0 in | Wt 185.0 lb

## 2023-09-27 DIAGNOSIS — E66811 Obesity, class 1: Secondary | ICD-10-CM | POA: Insufficient documentation

## 2023-09-27 DIAGNOSIS — R0602 Shortness of breath: Secondary | ICD-10-CM

## 2023-09-27 DIAGNOSIS — F17210 Nicotine dependence, cigarettes, uncomplicated: Secondary | ICD-10-CM

## 2023-09-27 DIAGNOSIS — Z6833 Body mass index (BMI) 33.0-33.9, adult: Secondary | ICD-10-CM | POA: Insufficient documentation

## 2023-09-27 DIAGNOSIS — E6609 Other obesity due to excess calories: Secondary | ICD-10-CM | POA: Insufficient documentation

## 2023-09-27 DIAGNOSIS — G4733 Obstructive sleep apnea (adult) (pediatric): Secondary | ICD-10-CM | POA: Insufficient documentation

## 2023-09-27 DIAGNOSIS — I10 Essential (primary) hypertension: Secondary | ICD-10-CM | POA: Insufficient documentation

## 2023-09-27 DIAGNOSIS — E669 Obesity, unspecified: Secondary | ICD-10-CM | POA: Insufficient documentation

## 2023-09-27 MED ORDER — ALBUTEROL SULFATE HFA 90 MCG/ACTUATION AEROSOL INHALER
1.0000 | INHALATION_SPRAY | RESPIRATORY_TRACT | 5 refills | Status: DC | PRN
Start: 2023-09-27 — End: 2024-10-23

## 2023-09-27 NOTE — Nursing Note (Signed)
Epworth Scale Score:   2    Are you currently on CPAP/BIPAP/TRILOGY CPAP  DME Company Information: [employer]  Has previous OSA symptoms resolved with CPAP/BIPAP/TRILOGY?Yes  Download available? No, Reason: Pt does not have an SD card   PT has report on her phone from APP   Currently experiencing any additional sleep related issues?No  Oxygen therapy?  No  DME Company Information:   What Liter Flow is the patient on?   Have you received a flu shot? No  Have you received a pneumonia shot? No  Smoking History:    Social History     Tobacco Use   Smoking Status Never   Smokeless Tobacco Never     Have you had any recent hospitalizations?  No  Has the patient had any tests performed since the last visit? Chest Xray  If so, where were the test(s) performed? PAC   Does the patient have any other associated symptoms or modifying factors? None.    Bing Plume, MA

## 2023-09-27 NOTE — Progress Notes (Signed)
[facility]  [address]  [place] [address]  Operated by [facility]     Follow up/Progress Note    Patient Name: Andrea Summers  Date: 09/27/2023  Department:  [facility]  MRN: [id number]  DOB: Dec 30, 1965  Primary Care Provider:  Ria Comment, PA  Referring Provider:  Ria Comment, PA      Chief Complaint:   Chief Complaint   Patient presents with    Sleep Apnea    Follow Up     Nursing Notes:   Bing Plume, MA  09/27/23 1046  Signed     09/27/23 1000   Situation   Sitting and Reading 1   Watching TV 0   Sitting inactive in a public place. 0   As a passenger in a car for an hour without a break. 0   Lying down to rest in the afternoon when circumstances permit. 0   Sitting and Talking to someone 0   Sitting quietly after a lunch without alcohol 1   In a car, while stopped for a few minutes in traffic 0   Epworth Sleepiness Scale Score   Score total 2         Swecker, Mildred, [place]  09/27/23 1050  Signed  Epworth Scale Score:   2    Are you currently on CPAP/BIPAP/TRILOGY CPAP  DME Company Information: [employer]  Has previous OSA symptoms resolved with CPAP/BIPAP/TRILOGY?Yes  Download available? No, Reason: Pt does not have an SD card   PT has report on her phone from APP   Currently experiencing any additional sleep related issues?No  Oxygen therapy?  No  DME Company Information:   What Liter Flow is the patient on?   Have you received a flu shot? No  Have you received a pneumonia shot? No  Smoking History:    Social History     Tobacco Use   Smoking Status Never   Smokeless Tobacco Never     Have you had any recent hospitalizations?  No  Has the patient had any tests performed since the last visit? Chest Xray  If so, where were the test(s) performed? PAC   Does the patient have any other associated symptoms or modifying factors? None.    Bing Plume, MA      HPI:  57 year old female initially evaluated 05/03/2023 for  OSA.  She has known history of OSA diagnosed in 2017 per Dr. Lavell Anchors (AHI 11).  At her last visit she was experiencing difficulty with her CPAP machine and had noted increase in snoring and fatigue.  She underwent titration study after last visit that showed need for CPAP 9 cm H2O.  She received new CPAP machine and tolerates CPAP well.  No download available for review but app on patient's phone reviewed showing excellent compliance and AHI < 2.    At initial visit she relayed some intermittent dyspnea for which she uses PRN albuterol.  She is an active smoker, has approximate 10 pack-year smoking history, approximately 1/2 ppd.  She returns today for follow-up with chest x-ray and PFTs.  Chest x-ray is negative for acute cardiopulmonary process (see full report below).  PFTs are normal.  She feels like her breathing is stable.  Has intermittent mild exertional shortness of breath and wheezing and uses albuterol a couple of times per month with good results.      Past Medical History:  Past Medical History:   Diagnosis  Date    CKD (chronic kidney disease)     Essential hypertension     Hyperlipidemia     OSA (obstructive sleep apnea)      Past Surgical History  Past Surgical History:   Procedure Laterality Date    BRAIN TUMOR EXCISION      CESAREAN SECTION      HX BREAST BIOPSY Bilateral     HX CHOLECYSTECTOMY      HX HYSTERECTOMY      HX TUBAL LIGATION       Medication List  Current Outpatient Medications   Medication Sig    albuterol sulfate (PROVENTIL OR VENTOLIN OR PROAIR) 90 mcg/actuation Inhalation oral inhaler Take 1-2 Puffs by inhalation Every 4 hours as needed    albuterol sulfate (PROVENTIL) 2.5 mg /3 mL (0.083 %) Inhalation nebulizer solution INHALE 3 ML BY NEBULIZATION 4 TIMES A DAY AS NEEDED    amLODIPine (NORVASC) 5 mg Oral Tablet Take 1 Tablet (5 mg total) by mouth Once a day    DULoxetine (CYMBALTA DR) 60 mg Oral Capsule, Delayed Release(E.C.)     escitalopram oxalate (LEXAPRO) 10 mg Oral Tablet      ipratropium-albuterol 0.5 mg-3 mg(2.5 mg base)/3 mL Solution for Nebulization INHALE 3 ML BY NEBULIZATION 4 TIMES A DAY AS NEEDED    metoprolol succinate (TOPROL-XL) 50 mg Oral Tablet Sustained Release 24 hr TAKE 1 TABLET BY MOUTH EVERY DAY DO NOT CRUSH OR CHEW    naproxen (NAPROSYN) 500 mg Oral Tablet Take 1 Tablet (500 mg total) by mouth Twice per day as needed    oxymetazoline HCl (SINEX LONG-ACTING NASL) Administer into affected nostril(s)    rOPINIRole (REQUIP) 1 mg Oral Tablet Take 1 Tablet (1 mg total) by mouth Every night    rosuvastatin (CRESTOR) 20 mg Oral Tablet Take 1 Tablet (20 mg total) by mouth Once a day    tiotropium bromide (SPIRIVA RESPIMAT) 2.5 mcg/actuation Inhalation oral inhaler Take 2 Inhalations (2 Puffs total) by inhalation Once a day for 30 days    topiramate (TOPAMAX) 50 mg Oral Tablet Take 1 Tablet (50 mg total) by mouth Every night    torsemide (DEMADEX) 5 mg Oral Tablet Take 1 Tablet (5 mg total) by mouth Once a day    traZODone (DESYREL) 100 mg Oral Tablet Take 1 Tablet (100 mg total) by mouth Once a day    venlafaxine (EFFEXOR XR) 75 mg Oral Capsule, Sust. Release 24 hr TAKE 1 CAPSULE ER 24HR ORAL DAILY FOR 30 DAYS.     Allergy List  Allergy History as of 09/27/23        No Known Allergies                  Family History   Family Medical History:    None         Social History  Social History     Socioeconomic History    Marital status: Married   Tobacco Use    Smoking status: Former     Current packs/day: 0.50     Average packs/day: 0.9 packs/day for 41.8 years (37.9 ttl pk-yrs)     Types: Cigarettes     Start date: 1983    Smokeless tobacco: Never   Vaping Use    Vaping status: Every Day    Start date: 12/14/2019    Substances: Nicotine    Devices: Refillable tank        Review of system  General:  Denies fever, chills,  night sweats.  Neurological:  Denies confusion, dizziness.   Gastrointestinal:  Denies reflux, heartburn, diarrhea.  Cardiovascular:  Denies chest pain, irregular  heartbeats.  Pulmonary:  see HPI      Objective:  Vital Signs  Vitals:    09/27/23 1048   BP: 124/78   Pulse: 66   Resp: 16   SpO2: 95%   Weight: 83.9 kg (185 lb)   Height: 1.575 m (5\' 2" )   BMI: 33.91         PHYSICAL EXAMINATION:   Constitutional:  Vital signs stable.  General appearance of the patient:  Alert, no acute distress.  Normal appearance, well nourished.  Eyes: PERRLA and normal eye lids.  Conjunctiva normal.  Ears, Nose, Mouth, and Throat: External inspection of ears and nose with normal appearance.  Inspection of lips, teeth and gums with normal appearance and oral mucosa normal.  Neck: Supple with trachea midline, non tender, no nodules, no masses, gland position midline.  Respiratory:  Auscultation of lungs with normal breath sounds, no rales, no rhonchi, no wheezing.  Respiratory effort with no tractions, breathing regular and unlabored.  Cardiovascular:  Regular rhythm and regular rate.  No murmur, no peripheral edema.  Gastrointestinal: Abdomen non-tender, no masses, no hepatomegaly present.  Musculoskeletal:  Normal gait and station, normal digits, no digital cyanosis or clubbing.  Mental Status/Psychiatric:  Alert, grossly oriented to person, place, and time.  Appropriate and normal mood.    Imaging  XR CHEST PA AND LATERAL  Narrative: Beverely Low A Ruddy    XR CHEST PA AND LATERAL performed on 09/27/2023 9:51 AM.    INDICATION:  F17.210: Cigarette nicotine dependence  R06.02: Shortness of breath   Additional History:  sob, smoker    TECHNIQUE:  Frontal and lateral views of the chest.  2 views/2 images submitted for interpretation.    COMPARISON:  Prior CTA chest of 07/05/2021 and chest radiograph of 07/05/2021  ___________________________________  FINDINGS:    The heart size and pulmonary vasculature are within normal limits, and the lungs are clear without focal consolidation, pleural effusion, or pneumothorax.  Cholecystectomy clips are noted in the right upper quadrant of the abdomen. Moderate  degenerative changes are seen in the spine.  ___________________________________  Impression: No evidence of acute cardiopulmonary process.     Radiologist location ID: [id number]      Assessment    ICD-10-CM    1. OSA (obstructive sleep apnea)  G47.33       2. Shortness of breath  R06.02       3. Class 1 obesity due to excess calories with serious comorbidity and body mass index (BMI) of 33.0 to 33.9 in adult  E66.811     E66.09     Z68.33       4. Cigarette nicotine dependence  F17.210       5. Hypertension, unspecified type  I10             Plan  Chest x-ray and PFT results reviewed with patient.  Respiratory symptoms remain stable.  Continue PRN albuterol.  Encouraged tobacco cessation.  OSA is well-controlled.  Continue CPAP at current settings.  Return to clinic in 6 months but instructed her to call sooner for any needs.    Continue medications as prescribed/directed unless changed by provider.    Plan of care discussed with patient.    Return in about 28 weeks (around 04/10/2024) for with Pearson Grippe, NP, Dr. Warden Fillers pod.    The patient was  given the opportunity to ask questions and those questions were answered to the patient's satisfaction. The patient was encouraged to call with any additional questions or concerns. Discussed with the patient effects and side effects of medications. Medication safety was discussed.  The patient was informed to contact the office within 7 business days if a message/lab results/referral/imaging results have not been conveyed to the patient.    Electronically signed by Glory Buff, APRN-FNP-BC    _______________________________    I personally saw and evaluated the patient as part of a shared service with an APP.    My substantive findings are:  MDM (complete) I have personally managed 2 or more stable chronic illnesses (OSA, shortness of breath, cigarette smoking)  I have reviewed and actively participated in management of patient's prescription medications.   (p.r.n. albuterol HFA/nebs, Spiriva. )  I have also independently reviewed and interpreted chest x-ray images as listed below.    Chest x-ray images done today were independently viewed: No acute processes.    Respiratory status is overall stable.  Continue Spiriva and p.r.n. albuterol.  Strongly encouraged tobacco cessation.  Sleep apnea is well-controlled on CPAP.  We will continue this.  Follow up in 6 months.        Carmie End, MD  09/27/2023 12:52      This note may have been partially generated using MModal Fluency Direct system, and there may be some incorrect words, spellings, and punctuation that were not noted in checking the note before saving.

## 2023-09-27 NOTE — Nursing Note (Signed)
09/27/23 1000   Situation   Sitting and Reading 1   Watching TV 0   Sitting inactive in a public place. 0   As a passenger in a car for an hour without a break. 0   Lying down to rest in the afternoon when circumstances permit. 0   Sitting and Talking to someone 0   Sitting quietly after a lunch without alcohol 1   In a car, while stopped for a few minutes in traffic 0   Epworth Sleepiness Scale Score   Score total 2

## 2023-10-16 ENCOUNTER — Other Ambulatory Visit: Payer: Self-pay | Admitting: Family

## 2023-10-16 DIAGNOSIS — E66811 Obesity, class 1: Secondary | ICD-10-CM

## 2023-11-23 ENCOUNTER — Other Ambulatory Visit (HOSPITAL_COMMUNITY): Payer: Self-pay | Admitting: Physician Assistant

## 2023-11-23 DIAGNOSIS — M25562 Pain in left knee: Secondary | ICD-10-CM

## 2023-12-09 ENCOUNTER — Other Ambulatory Visit (INDEPENDENT_AMBULATORY_CARE_PROVIDER_SITE_OTHER): Payer: 59

## 2024-01-23 ENCOUNTER — Ambulatory Visit
Admission: RE | Admit: 2024-01-23 | Discharge: 2024-01-23 | Disposition: A | Discharge status: Home / Self Care | Payer: 59 | Source: Ambulatory Visit | Attending: Physician Assistant | Admitting: Physician Assistant

## 2024-01-23 ENCOUNTER — Other Ambulatory Visit: Payer: Self-pay

## 2024-01-23 DIAGNOSIS — M25562 Pain in left knee: Secondary | ICD-10-CM | POA: Insufficient documentation

## 2024-01-24 DIAGNOSIS — M7122 Synovial cyst of popliteal space [Baker], left knee: Secondary | ICD-10-CM

## 2024-01-24 DIAGNOSIS — S83242A Other tear of medial meniscus, current injury, left knee, initial encounter: Secondary | ICD-10-CM

## 2024-03-28 ENCOUNTER — Encounter: Payer: Self-pay | Admitting: Dermatology

## 2024-03-28 ENCOUNTER — Ambulatory Visit: Payer: 59 | Admitting: Dermatology

## 2024-03-28 DIAGNOSIS — L814 Other melanin hyperpigmentation: Secondary | ICD-10-CM | POA: Diagnosis not present

## 2024-03-28 DIAGNOSIS — L578 Other skin changes due to chronic exposure to nonionizing radiation: Secondary | ICD-10-CM

## 2024-03-28 DIAGNOSIS — Z1283 Encounter for screening for malignant neoplasm of skin: Secondary | ICD-10-CM

## 2024-03-28 DIAGNOSIS — L729 Follicular cyst of the skin and subcutaneous tissue, unspecified: Secondary | ICD-10-CM

## 2024-03-28 DIAGNOSIS — M898X9 Other specified disorders of bone, unspecified site: Secondary | ICD-10-CM

## 2024-03-28 DIAGNOSIS — Z8589 Personal history of malignant neoplasm of other organs and systems: Secondary | ICD-10-CM

## 2024-03-28 DIAGNOSIS — L821 Other seborrheic keratosis: Secondary | ICD-10-CM

## 2024-03-28 DIAGNOSIS — Z85828 Personal history of other malignant neoplasm of skin: Secondary | ICD-10-CM

## 2024-03-28 DIAGNOSIS — W908XXA Exposure to other nonionizing radiation, initial encounter: Secondary | ICD-10-CM | POA: Diagnosis not present

## 2024-03-28 DIAGNOSIS — L720 Epidermal cyst: Secondary | ICD-10-CM

## 2024-03-28 DIAGNOSIS — D1801 Hemangioma of skin and subcutaneous tissue: Secondary | ICD-10-CM

## 2024-03-28 DIAGNOSIS — M2570 Osteophyte, unspecified joint: Secondary | ICD-10-CM

## 2024-03-28 DIAGNOSIS — D229 Melanocytic nevi, unspecified: Secondary | ICD-10-CM

## 2024-03-28 NOTE — Patient Instructions (Signed)

## 2024-03-28 NOTE — Progress Notes (Signed)
   Follow-Up Visit   Subjective  Kathleen Hull is a 58 y.o. female who presents for the following: Skin Cancer Screening and Full Body Skin Exam  The patient presents for Total-Body Skin Exam (TBSE) for skin cancer screening and mole check. The patient has spots, moles and lesions to be evaluated, some may be new or changing and the patient may have concern these could be cancer.  The following portions of the chart were reviewed this encounter and updated as appropriate: medications, allergies, medical history  Review of Systems:  No other skin or systemic complaints except as noted in HPI or Assessment and Plan.  Objective  Well appearing patient in no apparent distress; mood and affect are within normal limits.  A full examination was performed including scalp, head, eyes, ears, nose, lips, neck, chest, axillae, abdomen, back, buttocks, bilateral upper extremities, bilateral lower extremities, hands, feet, fingers, toes, fingernails, and toenails. All findings within normal limits unless otherwise noted below.   Relevant physical exam findings are noted in the Assessment and Plan.    Assessment & Plan   SKIN CANCER SCREENING PERFORMED TODAY.  ACTINIC DAMAGE - Chronic condition, secondary to cumulative UV/sun exposure - diffuse scaly erythematous macules with underlying dyspigmentation - Recommend daily broad spectrum sunscreen SPF 30+ to sun-exposed areas, reapply every 2 hours as needed.  - Staying in the shade or wearing long sleeves, sun glasses (UVA+UVB protection) and wide brim hats (4-inch brim around the entire circumference of the hat) are also recommended for sun protection.  - Call for new or changing lesions.  LENTIGINES, SEBORRHEIC KERATOSES, HEMANGIOMAS - Benign normal skin lesions - Benign-appearing - Call for any changes  MELANOCYTIC NEVI - Tan-brown and/or pink-flesh-colored symmetric macules and papules - Benign appearing on exam today - Observation -  Call clinic for new or changing moles - Recommend daily use of broad spectrum spf 30+ sunscreen to sun-exposed areas.   HISTORY OF SQUAMOUS CELL CARCINOMA OF THE SKIN - L sternum, treated by Dr. Tyrone Gallop in [place], [place] - No evidence of recurrence today - No lymphadenopathy - Recommend regular full body skin exams - Recommend daily broad spectrum sunscreen SPF 30+ to sun-exposed areas, reapply every 2 hours as needed.  - Call if any new or changing lesions are noted between office visits  EPIDERMAL INCLUSION CYST Exam: Subcutaneous nodule at L zygoma Benign-appearing. Exam most consistent with an epidermal inclusion cyst. Discussed that a cyst is a benign growth that can grow over time and sometimes get irritated or inflamed. Recommend observation if it is not bothersome. Discussed option of surgical excision to remove it if it is growing, symptomatic, or other changes noted. Please call for new or changing lesions so they can be evaluated.  Bony callus - L sup forehead 2ndary to previous trauma? Benign-appearing.  Observation.  Call clinic for new or changing lesions.  Recommend daily use of broad spectrum spf 30+ sunscreen to sun-exposed areas.    Return in about 1 year (around 03/28/2025) for TBSE - hx SCC.  Arlinda Lais, CMA, am acting as scribe for Celine Collard, MD .   Documentation: I have reviewed the above documentation for accuracy and completeness, and I agree with the above.  Celine Collard, MD

## 2024-04-03 ENCOUNTER — Ambulatory Visit (INDEPENDENT_AMBULATORY_CARE_PROVIDER_SITE_OTHER): Payer: Self-pay | Admitting: ORTHOPAEDIC SURGERY

## 2024-04-09 ENCOUNTER — Encounter (INDEPENDENT_AMBULATORY_CARE_PROVIDER_SITE_OTHER): Payer: Self-pay | Admitting: PULMONARY DISEASE

## 2024-04-09 ENCOUNTER — Other Ambulatory Visit: Payer: Self-pay

## 2024-04-09 ENCOUNTER — Ambulatory Visit: Payer: Self-pay | Attending: PULMONARY DISEASE | Admitting: PULMONARY DISEASE

## 2024-04-09 VITALS — BP 124/62 | HR 66 | Resp 16 | Ht 62.0 in | Wt 188.0 lb

## 2024-04-09 DIAGNOSIS — G4733 Obstructive sleep apnea (adult) (pediatric): Secondary | ICD-10-CM | POA: Insufficient documentation

## 2024-04-09 DIAGNOSIS — F17290 Nicotine dependence, other tobacco product, uncomplicated: Secondary | ICD-10-CM | POA: Insufficient documentation

## 2024-04-09 DIAGNOSIS — Z87891 Personal history of nicotine dependence: Secondary | ICD-10-CM | POA: Insufficient documentation

## 2024-04-09 DIAGNOSIS — R0602 Shortness of breath: Secondary | ICD-10-CM | POA: Insufficient documentation

## 2024-04-09 MED ORDER — VARENICLINE TARTRATE 0.5 MG (11)-1 MG (42) TABLETS IN A DOSE PACK
ORAL_TABLET | ORAL | 0 refills | Status: DC
Start: 2024-04-09 — End: 2024-10-26

## 2024-04-09 MED ORDER — VARENICLINE TARTRATE 1 MG TABLET
1.0000 mg | ORAL_TABLET | Freq: Two times a day (BID) | ORAL | 0 refills | Status: DC
Start: 2024-04-09 — End: 2024-05-01

## 2024-04-09 NOTE — Nursing Note (Signed)
 Epworth Scale Score:   0  Are you currently on CPAP/BIPAP/TRILOGY CPAP  DME Company Information: [employer]  Has previous OSA symptoms resolved with CPAP/BIPAP/TRILOGY?Yes  Download available? Yes  Currently experiencing any additional sleep related issues?No  Oxygen therapy?  No  DME Company Information:   What Liter Flow is the patient on?   Have you received a flu shot? No  Have you received a pneumonia shot? No  Smoking History:    Social History     Tobacco Use   Smoking Status Former    Current packs/day: 0.00    Average packs/day: 0.9 packs/day for 42.0 years (38.0 ttl pk-yrs)    Types: Cigarettes    Start date: 65    Quit date: 2025    Years since quitting: 0.3   Smokeless Tobacco Never     Have you had any recent hospitalizations?  No  Has the patient had any tests performed since the last visit? None  If so, where were the test(s) performed?   Does the patient have any other associated symptoms or modifying factors? Coughing  Cheryle Corral, MA

## 2024-04-09 NOTE — Nursing Note (Signed)
 04/09/24 0800   Situation   Sitting and Reading 0   Watching TV 0   Sitting inactive in a public place. 0   As a passenger in a car for an hour without a break. 0   Lying down to rest in the afternoon when circumstances permit. 0   Sitting and Talking to someone 0   Sitting quietly after a lunch without alcohol 0   In a car, while stopped for a few minutes in traffic 0   Epworth Sleepiness Scale Score   Score total 0

## 2024-04-09 NOTE — Progress Notes (Signed)
 PULMONARY, [facility] PULMONOLOGY  [address]  [name] [address]  Operated by [facility]     Follow up/Progress Note    Patient Name: Andrea Summers  Date: 04/09/2024  Department:  PULMONARY, [facility] PULMONOLOGY  MRN: [id number]  DOB: 1966/04/27  Primary Care Provider:  Ezekiel Hollingshead, PA  Referring Provider:  Ezekiel Hollingshead, PA      Chief Complaint:   Chief Complaint   Patient presents with    Sleep Apnea    Follow Up     Nursing Notes:   Cheryle Corral, [place]  04/09/24 6440  Signed     04/09/24 0800   Situation   Sitting and Reading 0   Watching TV 0   Sitting inactive in a public place. 0   As a passenger in a car for an hour without a break. 0   Lying down to rest in the afternoon when circumstances permit. 0   Sitting and Talking to someone 0   Sitting quietly after a lunch without alcohol 0   In a car, while stopped for a few minutes in traffic 0   Epworth Sleepiness Scale Score   Score total 0         Swecker, [place], [place]  04/09/24 0815  Signed  Epworth Scale Score:   0  Are you currently on CPAP/BIPAP/TRILOGY CPAP  DME Company Information: [employer]  Has previous OSA symptoms resolved with CPAP/BIPAP/TRILOGY?Yes  Download available? Yes  Currently experiencing any additional sleep related issues?No  Oxygen therapy?  No  DME Company Information:   What Liter Flow is the patient on?   Have you received a flu shot? No  Have you received a pneumonia shot? No  Smoking History:    Social History     Tobacco Use   Smoking Status Former    Current packs/day: 0.00    Average packs/day: 0.9 packs/day for 42.0 years (38.0 ttl pk-yrs)    Types: Cigarettes    Start date: 67    Quit date: 2025    Years since quitting: 0.3   Smokeless Tobacco Never     Have you had any recent hospitalizations?  No  Has the patient had any tests performed since the last visit? None  If so, where were the test(s) performed?   Does the patient have any other associated symptoms or modifying factors?  Coughing  Cheryle Corral, MA        HPI:   58 yo female here for third office visit for OSA.  Was seen as a new patient on 05/03/23 referred here by Ezekiel Hollingshead PA.    OSA - Diagnosed with OSA in 2017 through [facility], by Dr Jamesetta Mcbride.  On the initial visit was having some issues tolerating CPAP, and had a titration study on 07/10/23 and was titrated to a CPAP 9.  Recent compliance report showed 100% use with 97% greater than 4 hours.  AHI was 0.9 on CPAP of 9.  Still has some issues with it, especially some humidity issues.  Intermittently will have to much water in hosing.  Wakes up rested most nights, but still having intermittent issues sleeping well.  Dyspnea - Had this complaint at initial visit.  Had been recently started on PRN albuterol  HFA/nebs.  Was started on Spiriva  at initial visit was only on PRN albuterol  last visit that has doing well.  Dyspnea is stable and did not feel she needed the Spiriva .  Cigarette smoking - Smoked about  1/2 ppd for 20 yrs. (10 pack yr hx)  Quit smoking 5 yrs ago.  Now vapes daily.  Stable now and has not started back.  Still vaping several times a day.  Has quit in the past with Chantix.    CXR images from 09/27/23 reviewed:  No acute processes.    PFT tracing today reviewed:  Normal without bronchodilator response.    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      Past Medical History:  Past Medical History:   Diagnosis Date    CKD (chronic kidney disease)     Essential hypertension     Hyperlipidemia     OSA (obstructive sleep apnea)       Past Surgical History  Past Surgical History:   Procedure Laterality Date    BRAIN TUMOR EXCISION      CESAREAN SECTION      HX BREAST BIOPSY Bilateral     HX CHOLECYSTECTOMY      HX HYSTERECTOMY      HX TUBAL LIGATION        Medication List  Current Outpatient Medications   Medication Sig    albuterol  sulfate (PROVENTIL  OR VENTOLIN  OR PROAIR ) 90 mcg/actuation Inhalation oral inhaler Take 1-2 Puffs by inhalation  Every 4 hours as needed    albuterol  sulfate (PROVENTIL ) 2.5 mg /3 mL (0.083 %) Inhalation nebulizer solution INHALE 3 ML BY NEBULIZATION 4 TIMES A DAY AS NEEDED    amLODIPine (NORVASC) 5 mg Oral Tablet Take 1 Tablet (5 mg total) by mouth Daily    DULoxetine (CYMBALTA DR) 60 mg Oral Capsule, Delayed Release(E.C.)     escitalopram oxalate (LEXAPRO) 10 mg Oral Tablet     ipratropium-albuterol  0.5 mg-3 mg(2.5 mg base)/3 mL Solution for Nebulization INHALE 3 ML BY NEBULIZATION 4 TIMES A DAY AS NEEDED    metoprolol succinate (TOPROL-XL) 50 mg Oral Tablet Sustained Release 24 hr TAKE 1 TABLET BY MOUTH EVERY DAY DO NOT CRUSH OR CHEW    naproxen (NAPROSYN) 500 mg Oral Tablet Take 1 Tablet (500 mg total) by mouth Twice per day as needed    oxymetazoline HCl (SINEX LONG-ACTING NASL) Administer into affected nostril(s)    rOPINIRole (REQUIP) 1 mg Oral Tablet Take 1 Tablet (1 mg total) by mouth Every night    rosuvastatin (CRESTOR) 20 mg Oral Tablet Take 1 Tablet (20 mg total) by mouth Daily    topiramate (TOPAMAX) 50 mg Oral Tablet Take 1 Tablet (50 mg total) by mouth Every night    torsemide (DEMADEX) 5 mg Oral Tablet Take 1 Tablet (5 mg total) by mouth Daily    traZODone (DESYREL) 100 mg Oral Tablet Take 1 Tablet (100 mg total) by mouth Daily    venlafaxine (EFFEXOR XR) 75 mg Oral Capsule, Sust. Release 24 hr TAKE 1 CAPSULE ER 24HR ORAL DAILY FOR 30 DAYS.     Allergy List  Allergy History as of 04/09/24        No Known Allergies                  Family History   Family Medical History:    None         Social History  Social History     Socioeconomic History    Marital status: Married   Tobacco Use    Smoking status: Former     Current packs/day: 0.00     Average packs/day: 0.9 packs/day for 42.0 years (38.0 ttl  pk-yrs)     Types: Cigarettes     Start date: 53     Quit date: 2025     Years since quitting: 0.3    Smokeless tobacco: Never   Vaping Use    Vaping status: Every Day    Start date: 12/14/2019    Substances: Nicotine     Devices: Refillable tank        Review of system  All negative other than listed in HPI.    Objective:  Vital Signs  Vitals:    04/09/24 0814   BP: 124/62   Pulse: 66   Resp: 16   SpO2: 98%   Weight: 85.3 kg (188 lb)   Height: 1.575 m (5\' 2" )   BMI: 34.39      PHYSICAL EXAMINATION:   Constitutional:  Vital signs stable.  General appearance of the patient:  Alert, no acute distress.  Normal appearance, well nourished.  Neck: Supple with trachea midline, non tender, no nodules, no masses, gland position midline.  Respiratory:  Auscultation of lungs with normal breath sounds, no rales, no rhonchi, no wheezing.  Respiratory effort with no tractions, breathing regular and unlabored.  Cardiovascular:  Regular rhythm and regular rate.  No murmur, no peripheral edema.  Gastrointestinal: Abdomen non-tender, no masses, no hepatomegaly present.  Musculoskeletal:  Normal gait and station, normal digits, no digital cyanosis or clubbing.  Mental Status/Psychiatric:  Alert, grossly oriented to person, place, and time.  Appropriate and normal mood.        Assessment    ICD-10-CM    1. OSA (obstructive sleep apnea)  G47.33       2. Shortness of breath  R06.02       3. Ex-cigarette smoker  Z87.891       4. Vaping nicotine dependence, tobacco product  F17.290               Plan  Continue CPAP.  Discussed adjusting humidity as needed.  Continue prn albuterol .  Discussed decreasing the nicotine level in the vape and working on cutting back on it.  Will start Chantix for this as well.  Education about depression and other side effects given to the patient.  Does not qualify for LDCT at the moment.            MDM (complete) I have personally managed 2 or more stable chronic illnesses (OSA, tobacco abuse, dyspnea)  I have reviewed and actively participated in management of patient's prescription medications.   (Albuterol  and Chantix.)    Mindy Alu, MD  04/09/2024 08:23    Instructions  Continue medications as  prescribed/directed unless changed by provider.  Plan of care discussed with patient.    Return in about 28 weeks (around 10/22/2024).    The patient was given the opportunity to ask questions and those questions were answered to the patient's satisfaction. The patient was encouraged to call with any additional questions or concerns. Discussed with the patient effects and side effects of medications. Medication safety was discussed.  The patient was informed to contact the office within 7 business days if a message/lab results/referral/imaging results have not been conveyed to the patient.    Electronically signed by Mindy Alu, MD      This note may have been partially generated using MModal Fluency Direct system, and there may be some incorrect words, spellings, and punctuation that were not noted in checking the note before saving.

## 2024-05-01 ENCOUNTER — Other Ambulatory Visit (INDEPENDENT_AMBULATORY_CARE_PROVIDER_SITE_OTHER): Payer: Self-pay | Admitting: PULMONARY DISEASE

## 2024-05-19 ENCOUNTER — Other Ambulatory Visit: Payer: Self-pay | Admitting: Family

## 2024-05-25 ENCOUNTER — Encounter: Payer: 59 | Admitting: Family

## 2024-05-28 ENCOUNTER — Ambulatory Visit (INDEPENDENT_AMBULATORY_CARE_PROVIDER_SITE_OTHER): Admitting: Family

## 2024-05-28 ENCOUNTER — Encounter: Payer: Self-pay | Admitting: Family

## 2024-05-28 VITALS — BP 130/78 | HR 87 | Temp 97.9°F | Ht 64.0 in | Wt 190.6 lb

## 2024-05-28 DIAGNOSIS — Z0000 Encounter for general adult medical examination without abnormal findings: Secondary | ICD-10-CM

## 2024-05-28 DIAGNOSIS — L237 Allergic contact dermatitis due to plants, except food: Secondary | ICD-10-CM | POA: Insufficient documentation

## 2024-05-28 DIAGNOSIS — E66811 Obesity, class 1: Secondary | ICD-10-CM | POA: Diagnosis not present

## 2024-05-28 DIAGNOSIS — Z0001 Encounter for general adult medical examination with abnormal findings: Secondary | ICD-10-CM

## 2024-05-28 DIAGNOSIS — Z1231 Encounter for screening mammogram for malignant neoplasm of breast: Secondary | ICD-10-CM

## 2024-05-28 LAB — LIPID PANEL
Cholesterol: 186 mg/dL (ref 0–200)
HDL: 52.5 mg/dL (ref >39.00)
LDL Cholesterol: 110 mg/dL — ABNORMAL HIGH (ref 0–99)
NonHDL: 133.5
Total CHOL/HDL Ratio: 4
Triglycerides: 120 mg/dL (ref 0.0–149.0)
VLDL: 24 mg/dL (ref 0.0–40.0)

## 2024-05-28 LAB — COMPREHENSIVE METABOLIC PANEL WITH GFR
ALT: 21 U/L (ref 0–35)
AST: 18 U/L (ref 0–37)
Albumin: 4.6 g/dL (ref 3.5–5.2)
Alkaline Phosphatase: 75 U/L (ref 39–117)
BUN: 15 mg/dL (ref 6–23)
CO2: 29 meq/L (ref 19–32)
Calcium: 9.3 mg/dL (ref 8.4–10.5)
Chloride: 105 meq/L (ref 96–112)
Creatinine, Ser: 1.05 mg/dL (ref 0.40–1.20)
GFR: 59.81 mL/min — ABNORMAL LOW (ref >60.00)
Glucose, Bld: 87 mg/dL (ref 70–99)
Potassium: 4.2 meq/L (ref 3.5–5.1)
Sodium: 140 meq/L (ref 135–145)
Total Bilirubin: 0.6 mg/dL (ref 0.2–1.2)
Total Protein: 7.3 g/dL (ref 6.0–8.3)

## 2024-05-28 LAB — CBC WITH DIFFERENTIAL/PLATELET
Basophils Absolute: 0 10*3/uL (ref 0.0–0.1)
Basophils Relative: 0.9 % (ref 0.0–3.0)
Eosinophils Absolute: 0.2 10*3/uL (ref 0.0–0.7)
Eosinophils Relative: 3.1 % (ref 0.0–5.0)
HCT: 40.6 % (ref 36.0–46.0)
Hemoglobin: 13.5 g/dL (ref 12.0–15.0)
Lymphocytes Relative: 32 % (ref 12.0–46.0)
Lymphs Abs: 1.7 10*3/uL (ref 0.7–4.0)
MCHC: 33.3 g/dL (ref 30.0–36.0)
MCV: 96.3 fl (ref 78.0–100.0)
Monocytes Absolute: 0.3 10*3/uL (ref 0.1–1.0)
Monocytes Relative: 6.1 % (ref 3.0–12.0)
Neutro Abs: 3.1 10*3/uL (ref 1.4–7.7)
Neutrophils Relative %: 57.9 % (ref 43.0–77.0)
Platelets: 290 10*3/uL (ref 150.0–400.0)
RBC: 4.21 Mil/uL (ref 3.87–5.11)
RDW: 13 % (ref 11.5–15.5)
WBC: 5.4 10*3/uL (ref 4.0–10.5)

## 2024-05-28 LAB — HEMOGLOBIN A1C: Hgb A1c MFr Bld: 5 % (ref 4.6–6.5)

## 2024-05-28 MED ORDER — TRIAMCINOLONE ACETONIDE 0.5 % EX OINT
1.0000 | TOPICAL_OINTMENT | Freq: Two times a day (BID) | CUTANEOUS | 2 refills | Status: AC
Start: 2024-05-28 — End: ?

## 2024-05-28 MED ORDER — PREDNISONE 10 MG PO TABS
ORAL_TABLET | ORAL | 0 refills | Status: DC
Start: 2024-05-28 — End: 2024-05-31

## 2024-05-28 MED ORDER — METFORMIN HCL ER 500 MG PO TB24
500.0000 mg | ORAL_TABLET | Freq: Every evening | ORAL | 2 refills | Status: AC
Start: 2024-05-28 — End: ?

## 2024-05-28 NOTE — Patient Instructions (Addendum)
 Start prednisone   Please get all doses in by noon each day as prednisone  can interfere with sleep.  Please let me know how you are doing and if you need a longer course of prednisone .  I have also prescribed a Kenalog ointment  Metformin  is used in prediabetes, diabetes, and also for weight loss by decreasing calorie consumption.   It works in a couple of ways by decreasing liver glucose production, decreases intestinal absorption of glucose and improves insulin sensitivity (increases peripheral glucose uptake and utilization).     Please make sure that you titrate per below so not to cause any GI upset.    Start metformin  XR with one 500mg  tablet at night. After one week, you may increase to two tablets at night ( total of 1000mg ) . The third week, you may take take two tablets at night and one tablet in the morning.  The fourth week, you may take two tablets in the morning ( 1000mg  total) and two tablets at night (1000mg  total). This will bring you to a maximum daily dose of 2000mg /day which is maximum dose.  So you are aware,  you may take ALL 4 tablets of metformin  together at the same time if preferable and doesn't cause GI upset. You may take metformin  2000mg  ( four of the 500mg  tablets) together in the morning or at night if you prefer.   Along the way, if you want to increase more slowly, please do as this medication can cause GI discomfort and loose stools which usually get better with time , however some patients find that they can only tolerate a certain dose and cannot increase to maximum dose.   Health Maintenance for Postmenopausal Women Menopause is a normal process in which your ability to get pregnant comes to an end. This process happens slowly over many months or years, usually between the ages of 72 and 56. Menopause is complete when you have missed your menstrual period for 12 months. It is important to talk with your health care provider about some of the most common  conditions that affect women after menopause (postmenopausal women). These include heart disease, cancer, and bone loss (osteoporosis). Adopting a healthy lifestyle and getting preventive care can help to promote your health and wellness. The actions you take can also lower your chances of developing some of these common conditions. What are the signs and symptoms of menopause? During menopause, you may have the following symptoms: Hot flashes. These can be moderate or severe. Night sweats. Decrease in sex drive. Mood swings. Headaches. Tiredness (fatigue). Irritability. Memory problems. Problems falling asleep or staying asleep. Talk with your health care provider about treatment options for your symptoms. Do I need hormone replacement therapy? Hormone replacement therapy is effective in treating symptoms that are caused by menopause, such as hot flashes and night sweats. Hormone replacement carries certain risks, especially as you become older. If you are thinking about using estrogen or estrogen with progestin, discuss the benefits and risks with your health care provider. How can I reduce my risk for heart disease and stroke? The risk of heart disease, heart attack, and stroke increases as you age. One of the causes may be a change in the body's hormones during menopause. This can affect how your body uses dietary fats, triglycerides, and cholesterol. Heart attack and stroke are medical emergencies. There are many things that you can do to help prevent heart disease and stroke. Watch your blood pressure High blood pressure causes heart disease  and increases the risk of stroke. This is more likely to develop in people who have high blood pressure readings or are overweight. Have your blood pressure checked: Every 3-5 years if you are 47-15 years of age. Every year if you are 1 years old or older. Eat a healthy diet  Eat a diet that includes plenty of vegetables, fruits, low-fat dairy  products, and lean protein. Do not eat a lot of foods that are high in solid fats, added sugars, or sodium. Get regular exercise Get regular exercise. This is one of the most important things you can do for your health. Most adults should: Try to exercise for at least 150 minutes each week. The exercise should increase your heart rate and make you sweat (moderate-intensity exercise). Try to do strengthening exercises at least twice each week. Do these in addition to the moderate-intensity exercise. Spend less time sitting. Even light physical activity can be beneficial. Other tips Work with your health care provider to achieve or maintain a healthy weight. Do not use any products that contain nicotine or tobacco. These products include cigarettes, chewing tobacco, and vaping devices, such as e-cigarettes. If you need help quitting, ask your health care provider. Know your numbers. Ask your health care provider to check your cholesterol and your blood sugar (glucose). Continue to have your blood tested as directed by your health care provider. Do I need screening for cancer? Depending on your health history and family history, you may need to have cancer screenings at different stages of your life. This may include screening for: Breast cancer. Cervical cancer. Lung cancer. Colorectal cancer. What is my risk for osteoporosis? After menopause, you may be at increased risk for osteoporosis. Osteoporosis is a condition in which bone destruction happens more quickly than new bone creation. To help prevent osteoporosis or the bone fractures that can happen because of osteoporosis, you may take the following actions: If you are 18-63 years old, get at least 1,000 mg of calcium  and at least 600 international units (IU) of vitamin D  per day. If you are older than age 8 but younger than age 66, get at least 1,200 mg of calcium  and at least 600 international units (IU) of vitamin D  per day. If you are  older than age 65, get at least 1,200 mg of calcium  and at least 800 international units (IU) of vitamin D  per day. Smoking and drinking excessive alcohol increase the risk of osteoporosis. Eat foods that are rich in calcium  and vitamin D , and do weight-bearing exercises several times each week as directed by your health care provider. How does menopause affect my mental health? Depression may occur at any age, but it is more common as you become older. Common symptoms of depression include: Feeling depressed. Changes in sleep patterns. Changes in appetite or eating patterns. Feeling an overall lack of motivation or enjoyment of activities that you previously enjoyed. Frequent crying spells. Talk with your health care provider if you think that you are experiencing any of these symptoms. General instructions See your health care provider for regular wellness exams and vaccines. This may include: Scheduling regular health, dental, and eye exams. Getting and maintaining your vaccines. These include: Influenza vaccine. Get this vaccine each year before the flu season begins. Pneumonia vaccine. Shingles vaccine. Tetanus, diphtheria, and pertussis (Tdap) booster vaccine. Your health care provider may also recommend other immunizations. Tell your health care provider if you have ever been abused or do not feel safe at home.  Summary Menopause is a normal process in which your ability to get pregnant comes to an end. This condition causes hot flashes, night sweats, decreased interest in sex, mood swings, headaches, or lack of sleep. Treatment for this condition may include hormone replacement therapy. Take actions to keep yourself healthy, including exercising regularly, eating a healthy diet, watching your weight, and checking your blood pressure and blood sugar levels. Get screened for cancer and depression. Make sure that you are up to date with all your vaccines. This information is not  intended to replace advice given to you by your health care provider. Make sure you discuss any questions you have with your health care provider. Document Revised: 04/20/2021 Document Reviewed: 04/20/2021 Elsevier Patient Education  2024 [employer].

## 2024-05-28 NOTE — Assessment & Plan Note (Addendum)
 HPI c/w contact dermatitis, suspect poison ivy. no systemic features.  Start prednisone .  Counseled on side effects.  Provided Kenalog as well.  She will let me know how she is doing

## 2024-05-28 NOTE — Assessment & Plan Note (Signed)
 Clinical breast exam performed today.  Deferred pelvic exam in the absence of complaints and Pap smear is up-to-date.  Encouraged continued walking.  She will schedule Shingrix vaccine here or local pharmacy.

## 2024-05-28 NOTE — Assessment & Plan Note (Signed)
 Discussed cardiovascular risk with phentermine  and lack of long-term maintenance strategy.  We discussed safer profile of metformin  and how to titrate.  Discussed side effects.  She will let me know how she is doing.

## 2024-05-28 NOTE — Progress Notes (Signed)
 Assessment & Plan:  Routine general medical examination at a health care facility Assessment & Plan: Clinical breast exam performed today.  Deferred pelvic exam in the absence of complaints and Pap smear is up-to-date.  Encouraged continued walking.  She will schedule Shingrix vaccine here or local pharmacy.   Orders: -     TSH; Future -     VITAMIN D  25 Hydroxy (Vit-D Deficiency, Fractures); Future -     Lipid panel; Future -     Hemoglobin A1c; Future -     Comprehensive metabolic panel with GFR; Future -     CBC with Differential/Platelet; Future  Encounter for screening mammogram for malignant neoplasm of breast -     3D Screening Mammogram, Left and Right; Future  Poison ivy dermatitis Assessment & Plan: HPI c/w contact dermatitis, suspect poison ivy. no systemic features.  Start prednisone .  Counseled on side effects.  Provided Kenalog as well.  She will let me know how she is doing  Orders: -     predniSONE ; Take 4 tablets ( total 40 mg) by mouth for 2 days; take 3 tablets ( total 30 mg) by mouth for 2 days; take 2 tablets ( total 20 mg) by mouth for 1 day; take 1 tablet ( total 10 mg) by mouth for 1 day.  Dispense: 17 tablet; Refill: 0 -     Triamcinolone Acetonide; Apply 1 Application topically 2 (two) times daily. Use sparingly for < 1 week  Dispense: 15 g; Refill: 2  Obesity (BMI 30.0-34.9) Assessment & Plan: Discussed cardiovascular risk with phentermine  and lack of long-term maintenance strategy.  We discussed safer profile of metformin  and how to titrate.  Discussed side effects.  She will let me know how she is doing.  Orders: -     metFORMIN  HCl ER; Take 1 tablet (500 mg total) by mouth every evening.  Dispense: 90 tablet; Refill: 2     Return precautions given.   Risks, benefits, and alternatives of the medications and treatment plan prescribed today were discussed, and patient expressed understanding.   Education regarding symptom management and diagnosis  given to patient on AVS either electronically or printed.  Return in about 4 months (around 09/27/2024).  Bascom Bossier, FNP  Subjective:    Patient ID: Kathleen Hull, female    DOB: 1966-08-04, 58 y.o.   MRN: [id number]  CC: Kathleen Hull is a 58 y.o. female who presents today for physical exam.    HPI: Complains of left arm rash which started 3 days ago after working in the yard.  She believes she came in contact with poison ivy which she is allergic to. Denies fevers, chills. She has been on prednisone  in the past without side effects.  She usually requires oral prednisone  when she comes in contact with poison ivy.  She also expresses her frustration as it relates to weight gain.  A year ago she went on phentermine .  Endorses lifestyle changes and much weight can reoccurs.  She tolerated phentermine  without chest pain palpitations or increased anxiety.  She is started exercising again    Follows in dermatology annually.  History of squamous cell skin cancer.  Colorectal Cancer Screening: UTD , Dr Antony Baumgartner,  07/18/2023, repeat in 7 years Breast Cancer Screening: Mammogram due Cervical Cancer Screening: UTD, obtain 05/24/2023, negative HPV, negative malignancy Bone Health screening/DEXA for 65+: No increased fracture risk. Defer screening at this time.        Tetanus - UTD  Exercise: Gets regular exercise, walking.   Alcohol use:  none Smoking/tobacco use: Nonsmoker.    Health Maintenance  Topic Date Due   Zoster (Shingles) Vaccine (1 of 2) Never done   COVID-19 Vaccine (2 - Janssen risk series) 03/21/2020   Flu Shot  07/13/2024   Mammogram  07/28/2025   Pap with HPV screening  05/23/2028   Colon Cancer Screening  07/17/2030   DTaP/Tdap/Td vaccine (3 - Td or Tdap) 05/19/2032   HIV Screening  Completed   HPV Vaccine  Aged Out   Meningitis B Vaccine  Aged Out   Hepatitis C Screening  Discontinued    ALLERGIES: Patient has no known allergies.  Current Outpatient  Medications on File Prior to Visit  Medication Sig Dispense Refill   buPROPion  (WELLBUTRIN  XL) 300 MG 24 hr tablet TAKE ONE TABLET BY MOUTH ONE TIME DAILY 90 tablet 3   Cholecalciferol (VITAMIN D ) 50 MCG (2000 UT) tablet Take 2,000 Units by mouth daily.     Multiple Vitamin (MULTIVITAMIN) tablet Take 1 tablet by mouth daily.     No current facility-administered medications on file prior to visit.    Review of Systems  Constitutional:  Negative for chills, fever and unexpected weight change.  HENT:  Negative for congestion.   Respiratory:  Negative for cough.   Cardiovascular:  Negative for chest pain, palpitations and leg swelling.  Gastrointestinal:  Negative for nausea and vomiting.  Musculoskeletal:  Negative for arthralgias and myalgias.  Skin:  Positive for rash.  Neurological:  Negative for headaches.  Hematological:  Negative for adenopathy.  Psychiatric/Behavioral:  Negative for confusion.       Objective:    BP 130/78   Pulse 87   Temp 97.9 F (36.6 C) (Oral)   Ht 5' 4 (1.626 m)   Wt 190 lb 9.6 oz (86.5 kg)   LMP 09/09/2015 (Approximate)   SpO2 98%   BMI 32.72 kg/m   BP Readings from Last 3 Encounters:  05/28/24 130/78  07/18/23 121/86  05/24/23 (!) 120/90   Wt Readings from Last 3 Encounters:  05/28/24 190 lb 9.6 oz (86.5 kg)  07/18/23 168 lb 6.4 oz (76.4 kg)  05/24/23 171 lb 9.6 oz (77.8 kg)    Physical Exam Vitals reviewed.  Constitutional:      Appearance: Normal appearance. She is well-developed.   Eyes:     Conjunctiva/sclera: Conjunctivae normal.   Neck:     Thyroid : No thyroid  mass or thyromegaly.   Cardiovascular:     Rate and Rhythm: Normal rate and regular rhythm.     Pulses: Normal pulses.     Heart sounds: Normal heart sounds.  Pulmonary:     Effort: Pulmonary effort is normal.     Breath sounds: Normal breath sounds. No wheezing, rhonchi or rales.  Chest:  Breasts:    Breasts are symmetrical.     Right: No inverted nipple,  mass, nipple discharge, skin change or tenderness.     Left: No inverted nipple, mass, nipple discharge, skin change or tenderness.  Abdominal:     General: Bowel sounds are normal. There is no distension.     Palpations: Abdomen is soft. Abdomen is not rigid. There is no fluid wave or mass.     Tenderness: There is no abdominal tenderness. There is no guarding or rebound.  Lymphadenopathy:     Head:     Right side of head: No submental, submandibular, tonsillar, preauricular, posterior auricular or occipital adenopathy.  Left side of head: No submental, submandibular, tonsillar, preauricular, posterior auricular or occipital adenopathy.     Cervical: No cervical adenopathy.     Right cervical: No superficial, deep or posterior cervical adenopathy.    Left cervical: No superficial, deep or posterior cervical adenopathy.   Skin:    General: Skin is warm and dry.     Comments: Left medial forearm erythematous macular papular rash approx 10cm in diameter.   Neurological:     Mental Status: She is alert.   Psychiatric:        Speech: Speech normal.        Behavior: Behavior normal.        Thought Content: Thought content normal.

## 2024-05-29 LAB — TSH: TSH: 3.47 u[IU]/mL (ref 0.450–4.500)

## 2024-05-29 LAB — VITAMIN D 25 HYDROXY (VIT D DEFICIENCY, FRACTURES): Vit D, 25-Hydroxy: 49.4 ng/mL (ref 30.0–100.0)

## 2024-05-31 ENCOUNTER — Other Ambulatory Visit: Payer: Self-pay | Admitting: Family

## 2024-05-31 ENCOUNTER — Encounter: Payer: Self-pay | Admitting: Family

## 2024-05-31 DIAGNOSIS — L237 Allergic contact dermatitis due to plants, except food: Secondary | ICD-10-CM

## 2024-05-31 MED ORDER — PREDNISONE 10 MG PO TABS: ORAL_TABLET | ORAL | 0 refills | Status: AC

## 2024-06-01 ENCOUNTER — Ambulatory Visit
Admission: EM | Admit: 2024-06-01 | Discharge: 2024-06-01 | Disposition: A | Discharge status: Home / Self Care | Attending: Emergency Medicine | Admitting: Emergency Medicine

## 2024-06-01 ENCOUNTER — Encounter: Payer: Self-pay | Admitting: Emergency Medicine

## 2024-06-01 ENCOUNTER — Ambulatory Visit: Payer: Self-pay | Admitting: Family

## 2024-06-01 ENCOUNTER — Ambulatory Visit: Payer: Self-pay

## 2024-06-01 DIAGNOSIS — L237 Allergic contact dermatitis due to plants, except food: Secondary | ICD-10-CM

## 2024-06-01 DIAGNOSIS — R21 Rash and other nonspecific skin eruption: Secondary | ICD-10-CM | POA: Diagnosis not present

## 2024-06-01 DIAGNOSIS — N189 Chronic kidney disease, unspecified: Secondary | ICD-10-CM

## 2024-06-01 MED ORDER — METHYLPREDNISOLONE ACETATE 80 MG/ML IJ SUSP
40.0000 mg | Freq: Once | INTRAMUSCULAR | Status: AC
  Administered 2024-06-01: 40 mg via INTRAMUSCULAR

## 2024-06-01 MED ORDER — METHYLPREDNISOLONE SODIUM SUCC 40 MG IJ SOLR: 60.0000 mg | Freq: Once | INTRAMUSCULAR | Status: DC

## 2024-06-01 NOTE — Telephone Encounter (Signed)
 Noted

## 2024-06-01 NOTE — Telephone Encounter (Signed)
 FYI Only or Action Required?: Action required by provider: request for appointment and update on patient condition.  Patient was last seen in primary care on 05/28/2024 by Calista Catching, FNP. Called Nurse Triage reporting Poison Ivy. Symptoms began several days ago. Interventions attempted: Prescription medications: prednisone  and Rest, hydration, or home remedies. Symptoms are: gradually worsening.  Triage Disposition: See HCP Within 4 Hours (Or PCP Triage)  Patient/caregiver understands and will follow disposition?:   Copied from CRM [id number]. Topic: Clinical - Red Word Triage >> Jun 01, 2024  9:48 AM Baldo Levan wrote: Red Word that prompted transfer to Nurse Triage: Patient has poison ivy, she seen the doctor for earlier this week and it is now much worse after the medication with huge blisters,that continue to grow. Reason for Disposition  Large blisters or oozing sores  Answer Assessment - Initial Assessment Questions 1. APPEARANCE of RASH: Describe the rash.      Poison ivy rash worsening, blistering-large 2. LOCATION: Where is the rash located?  (e.g., face, genitals, hands, legs)     arm 3. SIZE: How large is the rash?      3-4 blisters leaking, also has 3-4 blisters that are just growing and getting larger.  4. ONSET: When did the rash begin?      Several days ago 5. ITCHING: Does the rash itch? If Yes, ask: How bad is it?   - MILD - doesn't interfere with normal activities   - MODERATE-SEVERE: interferes with work, school, sleep, or other activities      mild  Additional: Prednisone  started on Monday, now worsening.Burning pain.  No appointments available at practice location, declines urgent care evaluation, would like follow up by PCP. She is questioning if she should receive an injection. Please follow up and advise.  Protocols used: Poison Ivy - [place] - [place]-A-AH

## 2024-06-01 NOTE — ED Provider Notes (Signed)
 [facility]    CSN: [id number] Arrival date & time: 06/01/24  1057      History   Chief Complaint Chief Complaint  Patient presents with   Rash    HPI Kathleen Hull is a 58 y.o. female.   58 year old female, Kathleen Hull, presents to urgent care for evaluation of rash to left upper extremity x 1 week, patient states she has been on tapering dose of prednisone  for poison ivy exposure /dermatitis but blisters and rash is getting worse.  Patient denies fever chills, patient denies any pain  The history is provided by the patient. No language interpreter was used.    Past Medical History:  Diagnosis Date   Actinic keratosis    Anxiety    Hypertension    Squamous cell skin cancer 2013    Patient Active Problem List   Diagnosis Date Noted   Poison ivy dermatitis 05/28/2024   Adenomatous polyp of colon 07/18/2023   Encounter for screening colonoscopy 11/11/2020   Left shoulder pain 11/10/2020   Abnormal EKG 11/10/2020   HTN (hypertension) 08/08/2020   Vitamin D  deficiency 07/14/2018   Rash and nonspecific skin eruption 07/14/2018   Thyroid  disorder 08/05/2015   Pain in the chest 08/05/2015   Obesity (BMI 30.0-34.9) 07/07/2015   Menopausal symptoms 07/07/2015   Routine general medical examination at a health care facility 09/11/2013   Squamous cell carcinoma, trunk 09/11/2013   Urge incontinence 09/11/2013   Screening for breast cancer 02/22/2013    Past Surgical History:  Procedure Laterality Date   CHOLECYSTECTOMY     [place]   COLONOSCOPY WITH PROPOFOL  N/A 07/18/2023   Procedure: COLONOSCOPY WITH PROPOFOL ;  Surgeon: Luke Salaam, MD;  Location: [facility] ENDOSCOPY;  Service: Gastroenterology;  Laterality: N/A;   POLYPECTOMY  07/18/2023   Procedure: POLYPECTOMY;  Surgeon: Luke Salaam, MD;  Location: [facility] ENDOSCOPY;  Service: Gastroenterology;;   VAGINAL DELIVERY     3   WISDOM TOOTH EXTRACTION      OB History   No obstetric history on file.       Home Medications    Prior to Admission medications   Medication Sig Start Date End Date Taking? Authorizing Provider  buPROPion  (WELLBUTRIN  XL) 300 MG 24 hr tablet TAKE ONE TABLET BY MOUTH ONE TIME DAILY 05/21/24   Calista Catching, FNP  Cholecalciferol (VITAMIN D ) 50 MCG (2000 UT) tablet Take 2,000 Units by mouth daily.    [provider]  metFORMIN  (GLUCOPHAGE -XR) 500 MG 24 hr tablet Take 1 tablet (500 mg total) by mouth every evening. 05/28/24   Calista Catching, FNP  Multiple Vitamin (MULTIVITAMIN) tablet Take 1 tablet by mouth daily.    [provider]  predniSONE  (DELTASONE ) 10 MG tablet Take 4 tablets ( total 40 mg) by mouth for 2 days; take 3 tablets ( total 30 mg) by mouth for 2 days; take 2 tablets ( total 20 mg) by mouth for 1 day; take 1 tablet ( total 10 mg) by mouth for 1 day. 05/31/24   Calista Catching, FNP  triamcinolone ointment (KENALOG) 0.5 % Apply 1 Application topically 2 (two) times daily. Use sparingly for < 1 week 05/28/24   Calista Catching, FNP    Family History Family History  Problem Relation Age of Onset   Thyroid  disease Mother    Hypertension Mother    Diabetes Father    Hypertension Father    CAD Father    Breast cancer Neg Hx  Colon cancer Neg Hx    Thyroid  cancer Neg Hx     Social History Social History   Tobacco Use   Smoking status: Never   Smokeless tobacco: Never  Vaping Use   Vaping status: Never Used  Substance Use Topics   Alcohol use: No   Drug use: No     Allergies   Patient has no known allergies.   Review of Systems Review of Systems  Constitutional:  Negative for fever.  Skin:  Positive for color change and rash.  All other systems reviewed and are negative.    Physical Exam Triage Vital Signs ED Triage Vitals  Encounter Vitals Group     BP 06/01/24 1109 (!) 155/83     Girls Systolic BP Percentile --      Girls Diastolic BP Percentile --      Boys Systolic BP Percentile --       Boys Diastolic BP Percentile --      Pulse Rate 06/01/24 1109 60     Resp 06/01/24 1109 15     Temp 06/01/24 1109 98.2 F (36.8 C)     Temp Source 06/01/24 1109 Oral     SpO2 06/01/24 1109 98 %     Weight 06/01/24 1107 177 lb 7.5 oz (80.5 kg)     Height 06/01/24 1107 5' 4 (1.626 m)     Head Circumference --      Peak Flow --      Pain Score 06/01/24 1107 5     Pain Loc --      Pain Education --      Exclude from Growth Chart --    No data found.  Updated Vital Signs BP (!) 155/83 (BP Location: Right Arm)   Pulse 60   Temp 98.2 F (36.8 C) (Oral)   Resp 15   Ht 5' 4 (1.626 m)   Wt 177 lb 7.5 oz (80.5 kg)   LMP 09/09/2015 (Approximate)   SpO2 98%   BMI 30.46 kg/m   Visual Acuity Right Eye Distance:   Left Eye Distance:   Bilateral Distance:    Right Eye Near:   Left Eye Near:    Bilateral Near:     Physical Exam Vitals and nursing note reviewed.  Constitutional:      Appearance: She is well-developed and well-groomed.   Cardiovascular:     Rate and Rhythm: Normal rate.  Pulmonary:     Effort: Pulmonary effort is normal.   Skin:    General: Skin is warm.     Capillary Refill: Capillary refill takes less than 2 seconds.     Findings: Rash present. Rash is vesicular. Rash is not pustular.     Comments: Clear fluid-filled blisters/bullae to left inner forearm, see photo   Neurological:     General: No focal deficit present.     Mental Status: She is alert and oriented to person, place, and time.     GCS: GCS eye subscore is 4. GCS verbal subscore is 5. GCS motor subscore is 6.   Psychiatric:        Attention and Perception: Attention normal.        Mood and Affect: Mood normal.        Speech: Speech normal.        Behavior: Behavior normal. Behavior is cooperative.      UC Treatments / Results  Labs (all labs ordered are listed, but only abnormal results are displayed) Labs Reviewed -  No data to display  EKG   Radiology No results  found.  Procedures Procedures (including critical care time)  Medications Ordered in UC Medications  methylPREDNISolone  acetate (DEPO-MEDROL ) injection 40 mg (40 mg Intramuscular Given 06/01/24 1147)    Initial Impression / Assessment and Plan / UC Course  I have reviewed the triage vital signs and the nursing notes.  Pertinent labs & imaging results that were available during my care of the patient were reviewed by me and considered in my medical decision making (see chart for details).    Discussed exam findings and plan of care with patient, pt recently given second round of prednisone  has not picked up at pharmacy yet , will give Depo-Medrol  shot in office , strict go to ER precautions given.   Patient verbalized understanding to this provider.  Ddx: Poison ivy dermatitis, rash,allergies Final Clinical Impressions(s) / UC Diagnoses   Final diagnoses:  Rash and nonspecific skin eruption  Poison ivy dermatitis     Discharge Instructions      Avoid heat ,hot water as it makes rashes worse. Take meds as directed. Follow up with PCP in 3 days for wound recheck,sooner if worse. GO to Er for new or worsening issues(fever,streaking,pain,etc).   May use pepcid  and zyrtec as label directed for itching, calamine lotion topical, cover with nonstick dressing.      ED Prescriptions   None    PDMP not reviewed this encounter.   Peter Brands, NP 06/01/24 1506

## 2024-06-01 NOTE — ED Triage Notes (Signed)
 Patient has been on Prednisone  for a week for Poison Ivy.  Patient was seen at PCP on Monday.  Patient states on Wed she developed large blisters and has gotten worse on her left forearm.  Patient denies fevers or chills.

## 2024-06-01 NOTE — Telephone Encounter (Signed)
 LVM and sent message via my chart as well to call back to discuss pt message below

## 2024-06-01 NOTE — Telephone Encounter (Signed)
 See my chart message pt went to UC and was treated

## 2024-06-01 NOTE — Discharge Instructions (Signed)
 Avoid heat ,hot water as it makes rashes worse. Take meds as directed. Follow up with PCP in 3 days for wound recheck,sooner if worse. GO to Er for new or worsening issues(fever,streaking,pain,etc).   May use pepcid  and zyrtec as label directed for itching, calamine lotion topical, cover with nonstick dressing.

## 2024-06-05 ENCOUNTER — Ambulatory Visit: Payer: Self-pay | Attending: ORTHOPAEDIC SURGERY | Admitting: ORTHOPAEDIC SURGERY

## 2024-06-05 ENCOUNTER — Other Ambulatory Visit (HOSPITAL_BASED_OUTPATIENT_CLINIC_OR_DEPARTMENT_OTHER): Payer: Self-pay | Admitting: ORTHOPAEDIC SURGERY

## 2024-06-05 ENCOUNTER — Other Ambulatory Visit: Payer: Self-pay

## 2024-06-05 ENCOUNTER — Ambulatory Visit (HOSPITAL_BASED_OUTPATIENT_CLINIC_OR_DEPARTMENT_OTHER)

## 2024-06-05 ENCOUNTER — Encounter (INDEPENDENT_AMBULATORY_CARE_PROVIDER_SITE_OTHER): Payer: Self-pay | Admitting: ORTHOPAEDIC SURGERY

## 2024-06-05 VITALS — Temp 98.0°F | Ht 62.0 in | Wt 195.0 lb

## 2024-06-05 DIAGNOSIS — M1712 Unilateral primary osteoarthritis, left knee: Secondary | ICD-10-CM

## 2024-06-05 DIAGNOSIS — E669 Obesity, unspecified: Secondary | ICD-10-CM

## 2024-06-05 DIAGNOSIS — Z6835 Body mass index (BMI) 35.0-35.9, adult: Secondary | ICD-10-CM

## 2024-06-05 DIAGNOSIS — M25562 Pain in left knee: Secondary | ICD-10-CM

## 2024-06-05 DIAGNOSIS — M2392 Unspecified internal derangement of left knee: Secondary | ICD-10-CM | POA: Insufficient documentation

## 2024-06-05 DIAGNOSIS — M25569 Pain in unspecified knee: Secondary | ICD-10-CM | POA: Insufficient documentation

## 2024-06-05 DIAGNOSIS — E66811 Obesity, class 1: Secondary | ICD-10-CM | POA: Insufficient documentation

## 2024-06-05 MED ORDER — LIDOCAINE HCL 10 MG/ML (1 %) INJECTION SOLUTION
1.0000 mL | INTRAMUSCULAR | Status: AC
Start: 2024-06-05 — End: 2024-06-05
  Administered 2024-06-05: 5 mL via INTRAMUSCULAR

## 2024-06-05 MED ORDER — METHYLPREDNISOLONE ACETATE 40 MG/ML SUSPENSION FOR INJECTION
40.0000 mg | INTRAMUSCULAR | Status: AC
Start: 2024-06-05 — End: 2024-06-05
  Administered 2024-06-05: 80 mg via INTRA_ARTICULAR

## 2024-06-05 MED ORDER — NAPROXEN 500 MG TABLET
500.0000 mg | ORAL_TABLET | Freq: Two times a day (BID) | ORAL | 0 refills | Status: AC | PRN
Start: 2024-06-05 — End: ?

## 2024-06-05 NOTE — Procedures (Signed)
 [facility]  [address]  [place] [address]  Operated by [facility]  Procedure Note    Name: OLIVIAGRACE CRISANTI MRN:  [id number]   Date: 06/05/2024 DOB:  May 16, 1966 (57 y.o.)         [address]10 - ARTHROCENTESIS ASPIRATION/INJ, MAJOR JOINT/BURSA (SHOULDER/HIP/KNEE/SUBACROMIAL BURSA); WO US  (AMB ONLY-PD)    Performed by: Gorge Fredonia BIRCH, DO  Authorized by: Gorge Fredonia D, DO    Time Out:     Immediately before the procedure, a time out was called:  Yes    Patient verified:  Yes    Procedure Verified:  Yes    Site Verified:  Yes  Documentation:      Under sterile technique 80 mg Depo-Medrol and 5 cc of local anesthetic were injected in the patient's left knee today.  Patient tolerated injection well.  No complications from injection.  Patient is by ice to the knee for 20 min this evening after injection.        Pahoua Schreiner D Damyiah Moxley, DO

## 2024-06-05 NOTE — Progress Notes (Signed)
 [facility]  [address]  [place] [address]  Operated by [facility]    Name: Andrea Summers MRN:  [id number]   Date: 06/05/2024 DOB:  21-Sep-1966 (58 y.o.)       Date of Appointment: 06/05/2024    Chief Complaint:  Left knee pain    History of Present Illness:  Andrea Summers is a 58 y.o. female seen today for evaluation of her left knee.  States the knee has been hurting for about a year.  A few months ago she lost her job where she would feel a lot of walking she was happen use a cane.  States his knee is not quite as painful now because she is not on in his much but whenever she is on it she has significant pain.  She had an MRI obtained in February.      Current Outpatient Medications   Medication Sig    albuterol  sulfate (PROVENTIL  OR VENTOLIN  OR PROAIR ) 90 mcg/actuation Inhalation oral inhaler Take 1-2 Puffs by inhalation Every 4 hours as needed    albuterol  sulfate (PROVENTIL ) 2.5 mg /3 mL (0.083 %) Inhalation nebulizer solution INHALE 3 ML BY NEBULIZATION 4 TIMES A DAY AS NEEDED    amLODIPine (NORVASC) 5 mg Oral Tablet Take 1 Tablet (5 mg total) by mouth Daily    DULoxetine (CYMBALTA DR) 60 mg Oral Capsule, Delayed Release(E.C.)     escitalopram oxalate (LEXAPRO) 10 mg Oral Tablet     ipratropium-albuterol  0.5 mg-3 mg(2.5 mg base)/3 mL Solution for Nebulization INHALE 3 ML BY NEBULIZATION 4 TIMES A DAY AS NEEDED    metoprolol succinate (TOPROL-XL) 50 mg Oral Tablet Sustained Release 24 hr TAKE 1 TABLET BY MOUTH EVERY DAY DO NOT CRUSH OR CHEW    naproxen (NAPROSYN) 500 mg Oral Tablet Take 1 Tablet (500 mg total) by mouth Twice per day as needed    oxymetazoline HCl (SINEX LONG-ACTING NASL) Administer into affected nostril(s)    rOPINIRole (REQUIP) 1 mg Oral Tablet Take 1 Tablet (1 mg total) by mouth Every night    rosuvastatin (CRESTOR) 20 mg Oral Tablet Take 1 Tablet (20 mg total) by mouth Daily    topiramate (TOPAMAX) 50 mg Oral Tablet  Take 1 Tablet (50 mg total) by mouth Every night    torsemide (DEMADEX) 5 mg Oral Tablet Take 1 Tablet (5 mg total) by mouth Daily    traZODone (DESYREL) 100 mg Oral Tablet Take 1 Tablet (100 mg total) by mouth Daily    varenicline  (CHANTIX ) dose pak Take as directed.    varenicline  tartrate (CHANTIX ) 1 mg Oral Tablet TAKE 1 TABLET BY MOUTH TWICE DAILY.    venlafaxine (EFFEXOR XR) 75 mg Oral Capsule, Sust. Release 24 hr TAKE 1 CAPSULE ER 24HR ORAL DAILY FOR 30 DAYS.     Allergies[1]  Family Medical History:    None         Social History[2]          No data to display                  Physical Exam:  Temp 36.7 C (98 F)   Ht 1.575 m (5' 2)   Wt 88.5 kg (195 lb)   BMI 35.67 kg/m       Body mass index is 35.67 kg/m.   General:  No acute distress.  Alert and oriented.  Skin:  Warm, pink, no lesions, no edema.  Head:  Atraumatic, normocephalic.     Exam:    Patient's left knee has crepitus with range of motion   Significant medial joint line tenderness to palpation   Moderate effusion   Positive patellar grind   Positive seated and standing meniscal maneuver         Radiographic/Other Studies: reviewed  MRI was reviewed and independently interpreted by myself showing significant degenerative changes tricompartmental with large medial meniscus tear.  Intact cruciate and collateral ligaments  Radiographs today show moderate osteoarthritic changes left knee    Assessment:  (M25.569) Knee pain, unspecified chronicity, unspecified laterality  (primary encounter diagnosis)  Plan: 79389 - ARTHROCENTESIS ASPIRATION/INJ, MAJOR         JOINT/BURSA (SHOULDER/HIP/KNEE/SUBACROMIAL         BURSA); WO US  (AMB ONLY-PD)    (Z33.188) Obesity (BMI 30.0-34.9)    (M17.12) Primary osteoarthritis of left knee    (M23.92) Acute internal derangement of left knee      Procedures:  Injection provided today left knee    Plan:  The patients diagnosis and treatment options, both non-surgical and surgical, were discussed at length.  Risks,  benefits, alternatives and potential complications of these were discussed.  Findings of any radiographic studies or laboratory evaluation were discussed with the patient.  The  patient was given the opportunity to ask questions which were  then answered to the best of my ability.  The patient was informed of any follow up care or further studies needed and showed understanding of these.  These actions were discussed with the patient.    Today I provided a corticosteroid injection in left knee   Prescription for anti-inflammatories sent to pharmacy   Home exercise program to be done b.i.d.   Discussed the importance of weight loss and lower extremity joint health   Follow-up with me in 3 months for repeat evaluation      -The patient was instructed to seek medical attention urgently for new or worsening symptoms.    -All treatment options were discussed with the patient.    -All of the patient's questions were answered in the office today.    -Please call the office with any questions or concerns.          Orders Placed This Encounter    20610 - ARTHROCENTESIS ASPIRATION/INJ, MAJOR JOINT/BURSA (SHOULDER/HIP/KNEE/SUBACROMIAL BURSA); WO US  (AMB ONLY-PD)    naproxen (NAPROSYN) 500 mg Oral Tablet    methylPREDNISolone acetate (DEPO-medrol) 40 mg/mL injection    lidocaine 1% injection          Laurier Jasperson D Coryn Mosso, DO  06/05/2024, 12:43       [1] No Known Allergies  [2]   Social History  Tobacco Use    Smoking status: Former     Current packs/day: 0.00     Average packs/day: 0.9 packs/day for 42.0 years (38.0 ttl pk-yrs)     Types: Cigarettes     Start date: 31     Quit date: 2025     Years since quitting: 0.4    Smokeless tobacco: Never   Vaping Use    Vaping status: Every Day    Start date: 12/14/2019    Substances: Nicotine    Devices: Refillable tank

## 2024-07-30 ENCOUNTER — Ambulatory Visit

## 2024-08-03 ENCOUNTER — Other Ambulatory Visit (INDEPENDENT_AMBULATORY_CARE_PROVIDER_SITE_OTHER): Payer: Self-pay | Admitting: PULMONARY DISEASE

## 2024-08-03 DIAGNOSIS — F17290 Nicotine dependence, other tobacco product, uncomplicated: Secondary | ICD-10-CM

## 2024-08-03 NOTE — Telephone Encounter (Signed)
 Patient Pharmacy requested a refill of this medication. Per chart patient is to remain on this medication. Refilling.

## 2024-09-27 ENCOUNTER — Ambulatory Visit: Admitting: Family

## 2024-10-09 ENCOUNTER — Encounter (INDEPENDENT_AMBULATORY_CARE_PROVIDER_SITE_OTHER): Payer: Self-pay | Admitting: ORTHOPAEDIC SURGERY

## 2024-10-09 ENCOUNTER — Ambulatory Visit: Payer: Self-pay | Attending: ORTHOPAEDIC SURGERY | Admitting: ORTHOPAEDIC SURGERY

## 2024-10-09 ENCOUNTER — Other Ambulatory Visit: Payer: Self-pay

## 2024-10-09 VITALS — Temp 98.0°F | Ht 62.0 in | Wt 204.6 lb

## 2024-10-09 DIAGNOSIS — M1712 Unilateral primary osteoarthritis, left knee: Secondary | ICD-10-CM | POA: Insufficient documentation

## 2024-10-09 DIAGNOSIS — M170 Bilateral primary osteoarthritis of knee: Secondary | ICD-10-CM | POA: Insufficient documentation

## 2024-10-09 MED ORDER — LIDOCAINE HCL 10 MG/ML (1 %) INJECTION SOLUTION
1.0000 mL | INTRAMUSCULAR | Status: AC
Start: 2024-10-09 — End: 2024-10-09
  Administered 2024-10-09: 5 mL via INTRAMUSCULAR

## 2024-10-09 MED ORDER — METHYLPREDNISOLONE ACETATE 40 MG/ML SUSPENSION FOR INJECTION
40.0000 mg | INTRAMUSCULAR | Status: AC
Start: 2024-10-09 — End: 2024-10-09
  Administered 2024-10-09: 80 mg via INTRA_ARTICULAR

## 2024-10-09 NOTE — Procedures (Signed)
 ORTHOPEDICS, [facility]  [address]  [place] [address]  Operated by [facility]  Procedure Note    Name: Andrea Summers MRN:  [id number]   Date: 10/09/2024 DOB:  Mar 17, 1966 (57 y.o.)         20610 - ARTHROCENTESIS ASPIRATION/INJ, MAJOR JOINT/BURSA (SHOULDER/HIP/KNEE/SUBACROMIAL BURSA); WO US  (AMB ONLY-PD)    Performed by: Gorge Fredonia BIRCH, DO  Authorized by: Gorge Fredonia D, DO    Time Out:     Immediately before the procedure, a time out was called:  Yes    Patient verified:  Yes    Procedure Verified:  Yes    Site Verified:  Yes  Documentation:      Under sterile technique 80 mg Depo-Medrol  and 5 cc of local anesthetic were injected in the patient's left knee today.  Patient tolerated injection well.  No complications from injection.  Patient is by ice to the knee for 20 min this evening after injection.      Madalynn Raynor, PA-C   I was involved in the care of this patient by thorough review of the patient's history and physical examination, and pertinent studies. The medical decision making and treatment plan were supervised by me.I performed the required key elements of this patient evaluation. I have confirmed the relevant history and physical exam. I agree with the advanced practice provider in the documentation above. I performed greater than 50% of the total care and medical decision making process for this patient.      Conchetta Lamia DO

## 2024-10-09 NOTE — Addendum Note (Signed)
 Addended by: GORGE BRACKEN on: 10/09/2024 04:27 PM     Modules accepted: Level of Service

## 2024-10-23 ENCOUNTER — Other Ambulatory Visit: Payer: Self-pay

## 2024-10-23 ENCOUNTER — Ambulatory Visit (HOSPITAL_BASED_OUTPATIENT_CLINIC_OR_DEPARTMENT_OTHER)
Admission: RE | Admit: 2024-10-23 | Discharge: 2024-10-23 | Disposition: A | Discharge status: Home / Self Care | Payer: Self-pay | Source: Ambulatory Visit | Attending: NURSE PRACTITIONER | Admitting: NURSE PRACTITIONER

## 2024-10-23 ENCOUNTER — Ambulatory Visit: Payer: Self-pay | Attending: NURSE PRACTITIONER | Admitting: NURSE PRACTITIONER

## 2024-10-23 ENCOUNTER — Encounter (INDEPENDENT_AMBULATORY_CARE_PROVIDER_SITE_OTHER): Payer: Self-pay | Admitting: NURSE PRACTITIONER

## 2024-10-23 VITALS — BP 141/82 | HR 57 | Temp 97.2°F | Resp 16 | Ht 63.0 in | Wt 207.0 lb

## 2024-10-23 DIAGNOSIS — Z87891 Personal history of nicotine dependence: Secondary | ICD-10-CM | POA: Insufficient documentation

## 2024-10-23 DIAGNOSIS — R0609 Other forms of dyspnea: Secondary | ICD-10-CM

## 2024-10-23 DIAGNOSIS — G4733 Obstructive sleep apnea (adult) (pediatric): Secondary | ICD-10-CM | POA: Insufficient documentation

## 2024-10-23 DIAGNOSIS — R0602 Shortness of breath: Secondary | ICD-10-CM | POA: Insufficient documentation

## 2024-10-23 DIAGNOSIS — J410 Simple chronic bronchitis: Secondary | ICD-10-CM | POA: Insufficient documentation

## 2024-10-23 DIAGNOSIS — Z6836 Body mass index (BMI) 36.0-36.9, adult: Secondary | ICD-10-CM | POA: Insufficient documentation

## 2024-10-23 DIAGNOSIS — F17290 Nicotine dependence, other tobacco product, uncomplicated: Secondary | ICD-10-CM

## 2024-10-23 DIAGNOSIS — R058 Other specified cough: Secondary | ICD-10-CM

## 2024-10-23 DIAGNOSIS — E66812 Obesity, class 2: Secondary | ICD-10-CM | POA: Insufficient documentation

## 2024-10-23 DIAGNOSIS — G2581 Restless legs syndrome: Secondary | ICD-10-CM

## 2024-10-23 MED ORDER — SPIRIVA RESPIMAT 2.5 MCG/ACTUATION SOLUTION FOR INHALATION
2.0000 | Freq: Every day | RESPIRATORY_TRACT | 3 refills | Status: DC
Start: 2024-10-23 — End: 2024-10-26

## 2024-10-23 MED ORDER — ALBUTEROL SULFATE HFA 90 MCG/ACTUATION AEROSOL INHALER
1.0000 | INHALATION_SPRAY | RESPIRATORY_TRACT | 3 refills | Status: AC | PRN
Start: 2024-10-23 — End: ?

## 2024-10-23 NOTE — Progress Notes (Signed)
 PULMONARY, [facility] PULMONOLOGY  [address]  [place] [address]  Operated by [facility]     Follow up/Progress Note    Patient Name: Andrea Summers  Date: 10/23/2024  Department:  PULMONARY, [facility] PULMONOLOGY  MRN: [id number]  DOB: [birth year]  Primary Care Provider:  Cathlyn Candy, PA  Referring Provider:  Cathlyn Candy, PA      Chief Complaint:   Chief Complaint   Patient presents with    Sleep Apnea     Nursing Notes:   Sammy Browning, [place]  10/23/24 9167  Signed  Epworth Scale Score:  Score total: 0    Are you currently on CPAP/BIPAP/TRILOGY CPAP  DME Company Information: [employer]  Has previous OSA symptoms resolved with CPAP/BIPAP/TRILOGY?Yes  Download available? Yes  Currently experiencing any additional sleep related issues?No  Oxygen therapy?  No  DME Company Information: NA  What Liter Flow is the patient on? NA  Have you received a flu shot? Yes  Have you received a pneumonia shot? Yes  Smoking History:    Social History     Tobacco Use   Smoking Status Former    Current packs/day: 0.00    Average packs/day: 0.9 packs/day for 42.0 years (38.0 ttl pk-yrs)    Types: Cigarettes    Start date: 65    Quit date: 2025    Years since quitting: 0.8   Smokeless Tobacco Never     Have you had any recent hospitalizations?  No  Has the patient had any tests performed since the last visit? PFT  If so, where were the test(s) performed? [facility]  Does the patient have any other associated symptoms or modifying factors? Wheezing and Coughing      HPI:  58 year old female with OSA and shortness of breath who presents for routine follow-up.     OSA - Diagnosed with OSA in 2017 by Dr Kitty.  On initial visit in our office she was having difficulty with CPAP tolerance and underwent titration study 07/10/2023 showing need for CPAP 9 cm H2O.  She remains on CPAP and tolerates well.  States she cannot sleep without CPAP.  Download from 10/22/2024 shows 100% nights with greater than 4 hour use  with residual AHI 0.4 on 9 cm H2O.  On ropinirole for restless leg with good results.    Dyspnea - She has chronic exertional shortness of breath and intermittent cough that begins as a tickle in her throat with rare sputum production.  She was previously on Spiriva  with benefit but no longer has prescription.  Using albuterol  as needed.  She quit smoking in May.  Completed PFTs today that are essentially normal.      Cigarette smoking - Smoked approximately 1/2 ppd for 45 years, quit in May.         Past Medical History:  Past Medical History:   Diagnosis Date    CKD (chronic kidney disease)     Essential hypertension     Hyperlipidemia     OSA (obstructive sleep apnea)      Past Surgical History  Past Surgical History:   Procedure Laterality Date    BRAIN TUMOR EXCISION      CESAREAN SECTION      HX BREAST BIOPSY Bilateral     HX CHOLECYSTECTOMY      HX HYSTERECTOMY      HX TUBAL LIGATION       Medication List  Current Outpatient Medications   Medication Sig  albuterol  sulfate (PROVENTIL  OR VENTOLIN  OR PROAIR ) 90 mcg/actuation Inhalation oral inhaler Take 1-2 Puffs by inhalation Every 4 hours as needed    albuterol  sulfate (PROVENTIL ) 2.5 mg /3 mL (0.083 %) Inhalation nebulizer solution INHALE 3 ML BY NEBULIZATION 4 TIMES A DAY AS NEEDED    amLODIPine (NORVASC) 5 mg Oral Tablet Take 1 Tablet (5 mg total) by mouth Daily    DULoxetine (CYMBALTA DR) 60 mg Oral Capsule, Delayed Release(E.C.)     escitalopram oxalate (LEXAPRO) 10 mg Oral Tablet     ipratropium-albuterol  0.5 mg-3 mg(2.5 mg base)/3 mL Solution for Nebulization INHALE 3 ML BY NEBULIZATION 4 TIMES A DAY AS NEEDED    metoprolol succinate (TOPROL-XL) 50 mg Oral Tablet Sustained Release 24 hr TAKE 1 TABLET BY MOUTH EVERY DAY DO NOT CRUSH OR CHEW    naproxen  (NAPROSYN ) 500 mg Oral Tablet Take 1 Tablet (500 mg total) by mouth Twice per day as needed    oxymetazoline HCl (SINEX LONG-ACTING NASL) Administer into affected nostril(s)    rOPINIRole (REQUIP) 1 mg  Oral Tablet Take 1 Tablet (1 mg total) by mouth Every night    rosuvastatin (CRESTOR) 20 mg Oral Tablet Take 1 Tablet (20 mg total) by mouth Daily    tiotropium bromide (SPIRIVA  RESPIMAT) 2.5 mcg/actuation Inhalation oral inhaler Take 2 Inhalations (2 Puffs total) by inhalation Daily    topiramate (TOPAMAX) 50 mg Oral Tablet Take 1 Tablet (50 mg total) by mouth Every night    torsemide (DEMADEX) 5 mg Oral Tablet Take 1 Tablet (5 mg total) by mouth Daily    traZODone (DESYREL) 100 mg Oral Tablet Take 1 Tablet (100 mg total) by mouth Daily    varenicline  (CHANTIX ) dose pak Take as directed.    varenicline  tartrate (CHANTIX ) 1 mg Oral Tablet TAKE 1 TABLET BY MOUTH TWICE A DAY    venlafaxine (EFFEXOR XR) 75 mg Oral Capsule, Sust. Release 24 hr TAKE 1 CAPSULE ER 24HR ORAL DAILY FOR 30 DAYS.     Allergy List  Allergy History as of 10/23/24        No Known Allergies                  Family History   Family Medical History:    None         Social History  Social History     Socioeconomic History    Marital status: Married   Tobacco Use    Smoking status: Former     Current packs/day: 0.00     Average packs/day: 0.9 packs/day for 42.0 years (38.0 ttl pk-yrs)     Types: Cigarettes     Start date: 43     Quit date: 2025     Years since quitting: 0.8    Smokeless tobacco: Never   Vaping Use    Vaping status: Every Day    Start date: 12/14/2019    Substances: Nicotine    Devices: Refillable tank          Objective:  Vital Signs  Vitals:    10/23/24 0827   BP: (!) 141/82   Pulse: 57   Resp: 16   Temp: 36.2 C (97.2 F)   SpO2: 95%   Weight: 93.9 kg (207 lb)   Height: 1.6 m (5' 3)   BMI: 36.67         PHYSICAL EXAMINATION:   Constitutional:  Vital signs stable.  General appearance of the patient:  Alert, no acute distress.  Normal  appearance, well nourished.  Eyes: PERRLA and normal eye lids.  Conjunctiva normal.  Ears, Nose, Mouth, and Throat: External inspection of ears and nose with normal appearance.  Inspection of lips,  teeth and gums with normal appearance and oral mucosa normal.  Neck: Supple with trachea midline, non tender, no nodules, no masses, gland position midline.  Respiratory:  Auscultation of lungs with normal breath sounds, no rales, no rhonchi, no wheezing.  Respiratory effort with no tractions, breathing regular and unlabored.  Cardiovascular:  Regular rhythm and regular rate.  No murmur, no peripheral edema.  Gastrointestinal: Abdomen non-tender, no masses, no hepatomegaly present.  Musculoskeletal:  Normal gait and station, normal digits, no digital cyanosis or clubbing.  Mental Status/Psychiatric:  Alert, grossly oriented to person, place, and time.  Appropriate and normal mood.      Assessment    ICD-10-CM    1. Simple chronic bronchitis (CMS HCC)  J41.0       2. Shortness of breath  R06.02       3. Ex-cigarette smoker  Z87.891 CT LUNG CANCER SCREENING PROGRAM BASELINE      4. OSA (obstructive sleep apnea)  G47.33       5. Class 2 severe obesity due to excess calories with serious comorbidity and body mass index (BMI) of 36.0 to 36.9 in adult  E66.812     Z68.36             Plan  PFT results reviewed with patient.  Respiratory symptoms remain stable but has chronic exertional shortness of breath and intermittent cough.  Will resume Spiriva  and continue albuterol  PRN.  Will proceed with low-dose CT due to smoking history.  Continue CPAP at current settings.  Continue ropinirole for restless leg.  Return to clinic in 6 months but instructed her to call sooner for any needs.  She reports upcoming appointment with ortho for possible knee replacement.  She would be considered low risk from pulmonary standpoint.  Would recommend liberal use of bronchodilators and PAP therapy in the postop period.    Continue medications as prescribed/directed unless changed by provider.    Plan of care discussed with patient.    Return in about 28 weeks (around 05/07/2025) for with Rosaline Elbe, NP, Dr. Trenna pod.    The patient was  given the opportunity to ask questions and those questions were answered to the patient's satisfaction. The patient was encouraged to call with any additional questions or concerns. Discussed with the patient effects and side effects of medications. Medication safety was discussed.  The patient was informed to contact the office within 7 business days if a message/lab results/referral/imaging results have not been conveyed to the patient.    Electronically signed by Rosaline LITTIE Elbe, APRN, CNP      This note may have been partially generated using MModal Fluency Direct system, and there may be some incorrect words, spellings, and punctuation that were not noted in checking the note before saving.

## 2024-10-23 NOTE — Nursing Note (Signed)
 Epworth Scale Score:  Score total: 0    Are you currently on CPAP/BIPAP/TRILOGY CPAP  DME Company Information: [employer]  Has previous OSA symptoms resolved with CPAP/BIPAP/TRILOGY?Yes  Download available? Yes  Currently experiencing any additional sleep related issues?No  Oxygen therapy?  No  DME Company Information: NA  What Liter Flow is the patient on? NA  Have you received a flu shot? Yes  Have you received a pneumonia shot? Yes  Smoking History:    Social History     Tobacco Use   Smoking Status Former    Current packs/day: 0.00    Average packs/day: 0.9 packs/day for 42.0 years (38.0 ttl pk-yrs)    Types: Cigarettes    Start date: 35    Quit date: 2025    Years since quitting: 0.8   Smokeless Tobacco Never     Have you had any recent hospitalizations?  No  Has the patient had any tests performed since the last visit? PFT  If so, where were the test(s) performed? [facility]  Does the patient have any other associated symptoms or modifying factors? Wheezing and Coughing

## 2024-10-26 ENCOUNTER — Other Ambulatory Visit: Payer: Self-pay

## 2024-10-26 ENCOUNTER — Encounter (HOSPITAL_BASED_OUTPATIENT_CLINIC_OR_DEPARTMENT_OTHER): Payer: Self-pay

## 2024-10-26 ENCOUNTER — Encounter (INDEPENDENT_AMBULATORY_CARE_PROVIDER_SITE_OTHER): Payer: Self-pay

## 2024-10-26 ENCOUNTER — Ambulatory Visit (HOSPITAL_COMMUNITY)
Admission: RE | Admit: 2024-10-26 | Discharge: 2024-10-26 | Disposition: A | Discharge status: Home / Self Care | Source: Ambulatory Visit | Attending: ORTHOPAEDIC SURGERY | Admitting: ORTHOPAEDIC SURGERY

## 2024-10-26 ENCOUNTER — Ambulatory Visit: Payer: Self-pay

## 2024-10-26 ENCOUNTER — Other Ambulatory Visit: Payer: Self-pay | Admitting: Internal Medicine

## 2024-10-26 VITALS — BP 122/78 | HR 67 | Temp 98.6°F | Resp 18 | Ht 63.0 in | Wt 206.0 lb

## 2024-10-26 DIAGNOSIS — F419 Anxiety disorder, unspecified: Secondary | ICD-10-CM | POA: Insufficient documentation

## 2024-10-26 DIAGNOSIS — Z9889 Other specified postprocedural states: Secondary | ICD-10-CM | POA: Insufficient documentation

## 2024-10-26 DIAGNOSIS — M79609 Pain in unspecified limb: Secondary | ICD-10-CM | POA: Insufficient documentation

## 2024-10-26 DIAGNOSIS — G459 Transient cerebral ischemic attack, unspecified: Secondary | ICD-10-CM | POA: Insufficient documentation

## 2024-10-26 DIAGNOSIS — K5900 Constipation, unspecified: Secondary | ICD-10-CM

## 2024-10-26 DIAGNOSIS — G2581 Restless legs syndrome: Secondary | ICD-10-CM | POA: Insufficient documentation

## 2024-10-26 DIAGNOSIS — E785 Hyperlipidemia, unspecified: Secondary | ICD-10-CM | POA: Insufficient documentation

## 2024-10-26 DIAGNOSIS — E669 Obesity, unspecified: Secondary | ICD-10-CM | POA: Insufficient documentation

## 2024-10-26 DIAGNOSIS — Z13820 Encounter for screening for osteoporosis: Secondary | ICD-10-CM | POA: Insufficient documentation

## 2024-10-26 DIAGNOSIS — J449 Chronic obstructive pulmonary disease, unspecified: Secondary | ICD-10-CM | POA: Insufficient documentation

## 2024-10-26 DIAGNOSIS — F32A Depression, unspecified: Secondary | ICD-10-CM | POA: Insufficient documentation

## 2024-10-26 DIAGNOSIS — Z01818 Encounter for other preprocedural examination: Secondary | ICD-10-CM | POA: Insufficient documentation

## 2024-10-26 DIAGNOSIS — R0609 Other forms of dyspnea: Secondary | ICD-10-CM | POA: Insufficient documentation

## 2024-10-26 DIAGNOSIS — G4700 Insomnia, unspecified: Secondary | ICD-10-CM | POA: Insufficient documentation

## 2024-10-26 DIAGNOSIS — E28319 Asymptomatic premature menopause: Secondary | ICD-10-CM | POA: Insufficient documentation

## 2024-10-26 DIAGNOSIS — G4733 Obstructive sleep apnea (adult) (pediatric): Secondary | ICD-10-CM | POA: Insufficient documentation

## 2024-10-26 DIAGNOSIS — M1712 Unilateral primary osteoarthritis, left knee: Secondary | ICD-10-CM | POA: Insufficient documentation

## 2024-10-26 DIAGNOSIS — Z720 Tobacco use: Secondary | ICD-10-CM | POA: Insufficient documentation

## 2024-10-26 DIAGNOSIS — Z8603 Personal history of neoplasm of uncertain behavior: Secondary | ICD-10-CM | POA: Insufficient documentation

## 2024-10-26 LAB — COMPREHENSIVE METABOLIC PANEL, NON-FASTING
ALBUMIN: 4.3 g/dL (ref 3.5–5.2)
ALKALINE PHOSPHATASE: 102 U/L (ref 35–129)
ALT (SGPT): 27 U/L (ref <=33)
ANION GAP: 9 mmol/L (ref 7–18)
AST (SGOT): 19 U/L (ref 0–32)
BILIRUBIN TOTAL: 0.2 mg/dL (ref 0.2–1.2)
BUN: 19 mg/dL (ref 6–20)
CALCIUM: 9.4 mg/dL (ref 8.3–10.7)
CHLORIDE: 106 mmol/L (ref 96–106)
CO2 TOTAL: 24 mmol/L (ref 22–30)
CREATININE: 1.01 mg/dL — ABNORMAL HIGH (ref 0.50–0.90)
ESTIMATED GFR: 66 mL/min/1.73mˆ2 — ABNORMAL LOW (ref >90)
GLUCOSE: 178 mg/dL — ABNORMAL HIGH (ref 74–109)
POTASSIUM: 4.1 mmol/L (ref 3.2–5.0)
PROTEIN TOTAL: 6.8 g/dL (ref 6.4–8.3)
SODIUM: 139 mmol/L (ref 133–144)

## 2024-10-26 LAB — HGA1C (HEMOGLOBIN A1C WITH EST AVG GLUCOSE)
ESTIMATED AVERAGE GLUCOSE: 151 mg/dL
HEMOGLOBIN A1C: 6.9 % — ABNORMAL HIGH (ref 4.0–6.0)

## 2024-10-26 LAB — CBC
HCT: 41.8 % (ref 34.8–46.0)
HGB: 14.3 g/dL (ref 11.5–16.0)
MCH: 29.6 pg (ref 26.0–32.0)
MCHC: 34.2 g/dL (ref 31.0–35.5)
MCV: 86.5 fL (ref 78.0–100.0)
MPV: 11 fL (ref 8.7–12.5)
PLATELETS: 215 x10ˆ3/uL (ref 150–400)
RBC: 4.83 x10ˆ6/uL (ref 3.85–5.22)
RDW-CV: 13.3 % (ref 11.5–15.5)
WBC: 7.3 x10ˆ3/uL (ref 3.7–11.0)

## 2024-10-26 LAB — THYROID STIMULATING HORMONE WITH FREE T4 REFLEX: TSH: 2.32 u[IU]/mL (ref 0.270–4.200)

## 2024-10-26 LAB — VITAMIN D 25 TOTAL: VITAMIN D 25, TOTAL: 41.5 ng/mL (ref 30.00–100.00)

## 2024-10-26 LAB — PT/INR
INR: 0.91 (ref 0.90–1.10)
PROTHROMBIN TIME: 9.8 s (ref 8.9–11.4)

## 2024-10-28 DIAGNOSIS — R9431 Abnormal electrocardiogram [ECG] [EKG]: Secondary | ICD-10-CM

## 2024-10-28 DIAGNOSIS — Z01810 Encounter for preprocedural cardiovascular examination: Secondary | ICD-10-CM

## 2024-10-28 DIAGNOSIS — I440 Atrioventricular block, first degree: Secondary | ICD-10-CM

## 2024-10-28 DIAGNOSIS — R001 Bradycardia, unspecified: Secondary | ICD-10-CM

## 2024-10-28 LAB — ECG 12 LEAD
Atrial Rate: 56 {beats}/min
Calculated P Axis: 50 degrees
Calculated R Axis: 55 degrees
Calculated T Axis: 30 degrees
PR Interval: 218 ms
QRS Duration: 96 ms
QT Interval: 456 ms
QTC Calculation: 440 ms
Ventricular rate: 56 {beats}/min

## 2024-10-29 ENCOUNTER — Other Ambulatory Visit: Payer: Self-pay

## 2024-10-29 ENCOUNTER — Ambulatory Visit
Admission: RE | Admit: 2024-10-29 | Discharge: 2024-10-29 | Disposition: A | Discharge status: Home / Self Care | Payer: Self-pay | Source: Ambulatory Visit

## 2024-10-29 DIAGNOSIS — E28319 Asymptomatic premature menopause: Secondary | ICD-10-CM | POA: Insufficient documentation

## 2024-10-29 DIAGNOSIS — M810 Age-related osteoporosis without current pathological fracture: Secondary | ICD-10-CM

## 2024-10-29 DIAGNOSIS — Z13820 Encounter for screening for osteoporosis: Secondary | ICD-10-CM | POA: Insufficient documentation

## 2024-10-31 ENCOUNTER — Ambulatory Visit (HOSPITAL_BASED_OUTPATIENT_CLINIC_OR_DEPARTMENT_OTHER): Payer: Self-pay

## 2024-10-31 ENCOUNTER — Other Ambulatory Visit (INDEPENDENT_AMBULATORY_CARE_PROVIDER_SITE_OTHER): Payer: Self-pay | Admitting: PULMONARY DISEASE

## 2024-10-31 DIAGNOSIS — F17290 Nicotine dependence, other tobacco product, uncomplicated: Secondary | ICD-10-CM

## 2024-10-31 NOTE — Telephone Encounter (Signed)
 Spoke with patient regarding new finding of diabetes as well as osteoporosis. Encouraged patient to call PCP office to discuss further. We will send over a copy of results to PCP for review. Would like to get her started on treatment for osteoporosis prior to surgical intervention and we discussed the utility of this in detail. Plan to touch base with Patient next week to follow up to discuss next steps. She will also see cardiology in two days.

## 2024-11-01 NOTE — Telephone Encounter (Signed)
 Patient Pharmacy requested a refill of this medication. Per chart patient is to remain on this medication. Refilling.

## 2024-11-02 ENCOUNTER — Encounter (INDEPENDENT_AMBULATORY_CARE_PROVIDER_SITE_OTHER): Payer: Self-pay

## 2024-11-02 ENCOUNTER — Ambulatory Visit: Payer: Self-pay

## 2024-11-02 ENCOUNTER — Other Ambulatory Visit: Payer: Self-pay

## 2024-11-02 VITALS — BP 117/81 | HR 61 | Temp 97.8°F | Resp 16 | Ht 62.0 in | Wt 204.0 lb

## 2024-11-02 DIAGNOSIS — E669 Obesity, unspecified: Secondary | ICD-10-CM | POA: Insufficient documentation

## 2024-11-02 DIAGNOSIS — R9431 Abnormal electrocardiogram [ECG] [EKG]: Secondary | ICD-10-CM

## 2024-11-02 DIAGNOSIS — G4733 Obstructive sleep apnea (adult) (pediatric): Secondary | ICD-10-CM | POA: Insufficient documentation

## 2024-11-02 DIAGNOSIS — Z87891 Personal history of nicotine dependence: Secondary | ICD-10-CM | POA: Insufficient documentation

## 2024-11-02 DIAGNOSIS — Z01810 Encounter for preprocedural cardiovascular examination: Secondary | ICD-10-CM | POA: Insufficient documentation

## 2024-11-02 DIAGNOSIS — E782 Mixed hyperlipidemia: Secondary | ICD-10-CM | POA: Insufficient documentation

## 2024-11-02 DIAGNOSIS — I440 Atrioventricular block, first degree: Secondary | ICD-10-CM

## 2024-11-02 DIAGNOSIS — I10 Essential (primary) hypertension: Secondary | ICD-10-CM | POA: Insufficient documentation

## 2024-11-02 LAB — ECG 12 LEAD W/ INTERP (AMB USE ONLY) (MUSE, IN CLINC) (93005/93010)
Atrial Rate: 61 {beats}/min
Calculated P Axis: 42 degrees
Calculated R Axis: 41 degrees
Calculated T Axis: 30 degrees
PR Interval: 226 ms
QRS Duration: 94 ms
QT Interval: 438 ms
QTC Calculation: 440 ms
Ventricular rate: 61 {beats}/min

## 2024-11-02 NOTE — H&P (Signed)
 Cardiology [facility], [facility]  [address]  [place] [address]  [phone]    Cardiology  New Patient Clinic Note    Name: Andrea Summers   DOB: [birth year]  [57 y.o. female]   MRN: [id number]       Visit Date: 11/02/2024   Referring: No referring provider defined for this encounter.   PCP: Cathlyn Candy, PA         Reason for Visit:  Initial office visit with cardiology, preop cardiac evaluation  Chief Complaint:   Chief Complaint   Patient presents with    New Patient       History of Present Illness:  Andrea Summers is a 57 y.o. female with past medical history of hypertension, hyperlipidemia, OSA (On CPAP), former smoker, CVA/TIA, CKD, knee arthritis presents for preop cardiac evaluation for upcoming knee surgery (reports would be around January February next year).    Reports being independent in terms of activities of daily living and does cooking, cleaning, groceries by herself.  However, does not indulge in strenuous activities. Denies any shortness of breath, chest discomfort/pain, lightheadedness, symptoms suggestive of orthopnea/PND, leg swelling.  Denies any concerns for bleeding from anywhere including hemoptysis/hematemesis, denies change in color of urine or stools or any other bowel or bladder trouble.  Denies any numbness/tingling/weakness anywhere and face/limbs.    Reports compliance with CPAP.  Also on Crestor 20 mg daily.  No lipid panel on file.  Recently had an A1c elevated at 6.8, we will started on metformin 500 mg daily.    Reports quitting smoking in May this year (previously smoked 0.5 packs per day for about 2030 years).  Denies any regular alcohol use.    Denies any family history of premature atherosclerotic cardiovascular disease.    Reports having a stress test 5 6 years ago that was reportedly normal.    No Known Allergies    Medications:  Current Outpatient Medications   Medication Sig    albuterol  sulfate (PROVENTIL   OR VENTOLIN  OR PROAIR ) 90 mcg/actuation Inhalation oral inhaler Take 1-2 Puffs by inhalation Every 4 hours as needed    escitalopram oxalate (LEXAPRO) 10 mg Oral Tablet     ipratropium-albuterol  0.5 mg-3 mg(2.5 mg base)/3 mL Solution for Nebulization INHALE 3 ML BY NEBULIZATION 4 TIMES A DAY AS NEEDED    metFORMIN (GLUCOPHAGE) 500 mg Oral Tablet     naproxen  (NAPROSYN ) 500 mg Oral Tablet Take 1 Tablet (500 mg total) by mouth Twice per day as needed    rOPINIRole (REQUIP) 1 mg Oral Tablet Take 1 Tablet (1 mg total) by mouth Every night    rosuvastatin (CRESTOR) 20 mg Oral Tablet Take 1 Tablet (20 mg total) by mouth Daily    SPIRIVA  RESPIMAT 2.5 mcg/actuation Inhalation oral inhaler     topiramate (TOPAMAX) 50 mg Oral Tablet Take 1 Tablet (50 mg total) by mouth Every night    traZODone (DESYREL) 100 mg Oral Tablet Take 1 Tablet (100 mg total) by mouth Daily    varenicline  tartrate (CHANTIX ) 1 mg Oral Tablet TAKE 1 TABLET BY MOUTH TWICE A DAY       Patient History:  Past Medical History:   Diagnosis Date    Cavernous sinus tumor (CMS HCC)     CKD (chronic kidney disease)     Essential hypertension     Hyperlipidemia     OSA (obstructive sleep apnea)     with CPAP  Past Surgical History:   Procedure Laterality Date    BRAIN TUMOR EXCISION      2007 and 2008 causing mini strokes    CESAREAN SECTION      1992    HX BREAST BIOPSY Bilateral     HX CHOLECYSTECTOMY      2005    HX HYSTERECTOMY      Total    HX TUBAL LIGATION           Family Medical History:       Problem Relation (Age of Onset)    Cancer Father    Heart Attack Maternal Grandfather            Social History     Tobacco Use    Smoking status: Former     Current packs/day: 0.00     Average packs/day: 0.9 packs/day for 42.0 years (38.0 ttl pk-yrs)     Types: Cigarettes     Start date: 17     Quit date: 2025     Years since quitting: 0.8     Passive exposure: Past    Smokeless tobacco: Never   Vaping Use    Vaping status: Former    Start date: 12/14/2019     Quit date: 04/12/2024    Passive vaping exposure: Yes   Substance Use Topics    Alcohol use: Never    Drug use: Never        Review of Systems:  As noted in the HPI, otherwise unremarkable.      Physical Exam:  Constitutional: AA&O X3 Well developed and well nourished in no acute distress  Eyes: Conjunctiva clear, Pupils equal, round and reactive to light  HENT: Head is normocephalic, atraumatic   Neck: Normal ROM, Supple, symmetrical  Respiratory: Effort normal, clear to auscultation bilaterally.  Cardiovascular: regular rate and rhythm, S1, S2 normal, no murmur, click, rub or gallop  Gastrointestinal: Bowel sounds normal; soft, non distended non-tender to palpation  Extremities: extremities normal, atraumatic, no cyanosis or edema  Integumentary:  Skin warm and dry  Neurologic: Grossly normal, no focal neuro deficit, normal coordination and gait  Psychiatric: normal affect and speech.     Data Reviewed:  I have reviewed the patient's labs.  Pertinent results are as follows:    ECG - NSR, Anterior precordial TWI, slightly changed from EKG last week on 10/26/2024  Echo - N/A  Stress Test - patient reports she had stress test 6 years ago and that it was normal (no reports or images available for review)  Cath - N/A  Cardiac CT - N/A  Cardiac MRI - N/A    Assessment and Plan:      ICD-10-CM    1. Preop cardiovascular exam  Z01.810       2. Mixed hyperlipidemia  E78.2       3. OSA (obstructive sleep apnea)  G47.33       4. Obesity  E66.9       5. Hypertension, unspecified type  I10 ECG 12 Lead w/ Interp (MUSE  - In Clinic, Same Day)      6. Ex-cigarette smoker  Z87.891         Orders Placed This Encounter    ECG 12 Lead w/ Interp (MUSE  - In Clinic, Same Day)     -from physical activity standpoint, patient independent and asymptomatic with activities of daily living but does not indulge in exertional activity due to limitations from knee pain and arthritis.  We will  order pharmacological stress test for further  cardiovascular risk stratification.  Advised on not taking caffeine on the day of stress test.    -history of hypertension, blood pressure normal in the clinic today.  Reports blood pressure at home in 120s/80s.  Advised to maintain blood pressure log for clinic visit next time.  Advised on low-salt diet.  -history of hyperlipidemia, on Crestor 20 mg daily.  No lipid panel on file, we will order one today and adjust treatment as appropriate.  -A1c elevated at 6.8, on metformin 500 mg daily by PCP.  -patient compliant with CPAP, encouraged to continue.  -counseled on continuing abstinence from tobacco use.  -consult on heart healthy diet including more vegetables and fruits and avoiding cholesterol rich food.    Return in about 4 weeks (around 11/30/2024) for In Person Visit.      Crystal Gain, MD  11/02/2024, 08:19   [facility]  Cardiology  [facility]

## 2024-11-15 ENCOUNTER — Encounter (INDEPENDENT_AMBULATORY_CARE_PROVIDER_SITE_OTHER): Payer: Self-pay

## 2024-11-15 NOTE — Nursing Note (Signed)
 Date: 11-15-24  Approved Auth Number: npr   Person Spoke w: per [employer] 11-15-24  Insurance Contact Number: [phone]  Ordering Provider: DELENA Gain  Test/procedure code:  21547    I had requested that this patienf's stress test be scheduled ASAP. The patient is having knee surgery in January or February. I had sent a message to Marolyn Laity in Nuclear Medicine.

## 2024-11-16 ENCOUNTER — Telehealth (INDEPENDENT_AMBULATORY_CARE_PROVIDER_SITE_OTHER): Payer: Self-pay

## 2024-11-16 NOTE — Telephone Encounter (Addendum)
 At [facility]:   Test:  myocardial perfusion scan-nm  Date/Time:  12-19-24@ 745 am   Authorization:   npr per [facility]   I s/w pt on: 11-20-24   lm for pt on: 11-16-24  MUST DOCUMENT AT LEAST 2 PHONE CALLS.  I mailed the pt a letter with all the info they need for the testing on 11-21-24.

## 2024-11-20 ENCOUNTER — Encounter (INDEPENDENT_AMBULATORY_CARE_PROVIDER_SITE_OTHER): Payer: Self-pay

## 2024-11-27 ENCOUNTER — Ambulatory Visit
Admission: RE | Admit: 2024-11-27 | Discharge: 2024-11-27 | Disposition: A | Payer: Self-pay | Source: Ambulatory Visit | Attending: PULMONARY DISEASE

## 2024-11-27 ENCOUNTER — Other Ambulatory Visit: Payer: Self-pay

## 2024-11-27 ENCOUNTER — Ambulatory Visit (INDEPENDENT_AMBULATORY_CARE_PROVIDER_SITE_OTHER): Payer: Self-pay | Admitting: PULMONARY DISEASE

## 2024-11-27 DIAGNOSIS — Z87891 Personal history of nicotine dependence: Secondary | ICD-10-CM | POA: Insufficient documentation

## 2024-11-27 DIAGNOSIS — I2510 Atherosclerotic heart disease of native coronary artery without angina pectoris: Secondary | ICD-10-CM

## 2024-11-27 DIAGNOSIS — Z122 Encounter for screening for malignant neoplasm of respiratory organs: Secondary | ICD-10-CM

## 2024-11-27 NOTE — Result Encounter Note (Signed)
 Please make sure that the patient's PCP gets a copy of the CT chest and is aware of the fatty liver and coronary calcifications.

## 2024-11-28 ENCOUNTER — Telehealth (INDEPENDENT_AMBULATORY_CARE_PROVIDER_SITE_OTHER): Payer: Self-pay | Admitting: NURSE PRACTITIONER

## 2024-11-28 NOTE — Result Encounter Note (Signed)
 Low-dose CT showed tiny calcified granuloma left lower lobe but no worrisome nodules, coronary atherosclerosis, and fatty replaced liver.  Patient notified of results.  Scheduled for stress test next week.  Will send copy of report to PCP, Cathlyn Candy, PA.

## 2024-11-28 NOTE — Telephone Encounter (Signed)
 Please send copy of CT report to PCP, Cathlyn Candy, PA.  Thanks!

## 2024-11-28 NOTE — Result Encounter Note (Signed)
 Report faxed.

## 2024-11-28 NOTE — Telephone Encounter (Signed)
 Done

## 2024-12-19 ENCOUNTER — Telehealth (HOSPITAL_COMMUNITY): Payer: Self-pay

## 2024-12-19 ENCOUNTER — Ambulatory Visit (HOSPITAL_COMMUNITY)
Admission: RE | Admit: 2024-12-19 | Discharge: 2024-12-19 | Disposition: A | Discharge status: Home / Self Care | Source: Ambulatory Visit

## 2024-12-19 ENCOUNTER — Ambulatory Visit
Admission: RE | Admit: 2024-12-19 | Discharge: 2024-12-19 | Disposition: A | Discharge status: Home / Self Care | Source: Ambulatory Visit

## 2024-12-19 ENCOUNTER — Other Ambulatory Visit: Payer: Self-pay

## 2024-12-19 DIAGNOSIS — R9431 Abnormal electrocardiogram [ECG] [EKG]: Secondary | ICD-10-CM

## 2024-12-19 DIAGNOSIS — R9439 Abnormal result of other cardiovascular function study: Secondary | ICD-10-CM

## 2024-12-19 DIAGNOSIS — Z712 Person consulting for explanation of examination or test findings: Secondary | ICD-10-CM

## 2024-12-19 DIAGNOSIS — Z01810 Encounter for preprocedural cardiovascular examination: Secondary | ICD-10-CM | POA: Insufficient documentation

## 2024-12-19 LAB — MYOCARDIAL PERFUSION COMPLETE
Baseline HR: 75 {beats}/min
Nuc Stress EF: 65 %
Peak Diastolic BP for Stress Tests: 74 mmHg
Peak Systolic BP Stress Test: 126 mmHg
Post peak HR: 109 {beats}/min
Predicted Max HR: 164 {beats}/min
RECOVERY HR 2 MINS: 91 {beats}/min
RPP: 13734
Recovery HR: 86 {beats}/min
ST DEPRESSION - BASELINE: 0 mm
ST DEPRESSION - RECOVERY: 0
Target HR: 139 {beats}/min

## 2024-12-19 MED ORDER — AMINOPHYLLINE 250 MG/10 ML INTRAVENOUS SOLUTION
100.0000 mg | Freq: Once | INTRAVENOUS | Status: AC
  Administered 2024-12-19: 100 mg via INTRAVENOUS

## 2024-12-19 MED ORDER — REGADENOSON 0.4 MG/5 ML INTRAVENOUS SYRINGE
0.4000 mg | INJECTION | Freq: Once | INTRAVENOUS | Status: AC
  Administered 2024-12-19: 0.4 mg via INTRAVENOUS

## 2024-12-19 NOTE — Telephone Encounter (Signed)
 Discussed with the patient her stress test results as below -    Abnormal regadenoson  SPECT - Sestamibi myocardial perfusion study.    Clinically negative and electrocardiographically equivocal regadenoson  ECG portion of the study.    Stress Function Comments: Left ventricular function post-stress is normal. Stress ejection fraction is 65%.    Perfusion Defect: Abnormal imaging study.There is a left ventricular perfusion defect that is medium in size with minimal intensity present in the apical to mid inferior, inferolateral and apex location(s) that is partially reversible. There is normal wall motion in the defect area. The defect appears to be probable artifact caused by subdiaphragmatic activity. The possibility of ischemia cannot be excluded.      I explained to the patient that the next step at this time would be to obtain cardiac catheterization to evaluate for obstructive CAD prior to upcoming surgery.  Patient does have an appointment coming up on 12/21/2024 and looking forward to discussing this in further details.    Crystal Gain, MD

## 2024-12-21 ENCOUNTER — Other Ambulatory Visit: Payer: Self-pay

## 2024-12-21 ENCOUNTER — Ambulatory Visit (HOSPITAL_COMMUNITY)

## 2024-12-21 ENCOUNTER — Emergency Department: Admission: EM | Admit: 2024-12-21 | Discharge: 2024-12-21 | Disposition: A | Discharge status: Home / Self Care

## 2024-12-21 ENCOUNTER — Ambulatory Visit: Payer: Self-pay

## 2024-12-21 ENCOUNTER — Encounter (INDEPENDENT_AMBULATORY_CARE_PROVIDER_SITE_OTHER): Payer: Self-pay

## 2024-12-21 ENCOUNTER — Emergency Department (HOSPITAL_COMMUNITY)

## 2024-12-21 VITALS — BP 120/81 | HR 86 | Temp 98.8°F | Resp 18 | Ht 62.0 in | Wt 193.0 lb

## 2024-12-21 DIAGNOSIS — R9439 Abnormal result of other cardiovascular function study: Secondary | ICD-10-CM | POA: Insufficient documentation

## 2024-12-21 DIAGNOSIS — E785 Hyperlipidemia, unspecified: Secondary | ICD-10-CM | POA: Insufficient documentation

## 2024-12-21 DIAGNOSIS — Z87891 Personal history of nicotine dependence: Secondary | ICD-10-CM | POA: Insufficient documentation

## 2024-12-21 DIAGNOSIS — I639 Cerebral infarction, unspecified: Secondary | ICD-10-CM | POA: Insufficient documentation

## 2024-12-21 DIAGNOSIS — N189 Chronic kidney disease, unspecified: Secondary | ICD-10-CM | POA: Insufficient documentation

## 2024-12-21 DIAGNOSIS — R197 Diarrhea, unspecified: Secondary | ICD-10-CM | POA: Insufficient documentation

## 2024-12-21 DIAGNOSIS — E1122 Type 2 diabetes mellitus with diabetic chronic kidney disease: Secondary | ICD-10-CM | POA: Insufficient documentation

## 2024-12-21 DIAGNOSIS — I10 Essential (primary) hypertension: Secondary | ICD-10-CM | POA: Insufficient documentation

## 2024-12-21 DIAGNOSIS — Z8673 Personal history of transient ischemic attack (TIA), and cerebral infarction without residual deficits: Secondary | ICD-10-CM | POA: Insufficient documentation

## 2024-12-21 DIAGNOSIS — G4733 Obstructive sleep apnea (adult) (pediatric): Secondary | ICD-10-CM | POA: Insufficient documentation

## 2024-12-21 DIAGNOSIS — K760 Fatty (change of) liver, not elsewhere classified: Secondary | ICD-10-CM | POA: Insufficient documentation

## 2024-12-21 DIAGNOSIS — E669 Obesity, unspecified: Secondary | ICD-10-CM | POA: Insufficient documentation

## 2024-12-21 DIAGNOSIS — R109 Unspecified abdominal pain: Secondary | ICD-10-CM | POA: Insufficient documentation

## 2024-12-21 DIAGNOSIS — I129 Hypertensive chronic kidney disease with stage 1 through stage 4 chronic kidney disease, or unspecified chronic kidney disease: Secondary | ICD-10-CM | POA: Insufficient documentation

## 2024-12-21 DIAGNOSIS — Z7985 Long-term (current) use of injectable non-insulin antidiabetic drugs: Secondary | ICD-10-CM | POA: Insufficient documentation

## 2024-12-21 LAB — GI PANEL BY BIOFIRE FILM ARRAY
ADENOVIRUS F 40/41: NOT DETECTED
ASTROVIRUS: NOT DETECTED
CAMPYLOBACTER: NOT DETECTED
CRYPTOSPORIDIUM: NOT DETECTED
CYCLOSPORA CAYETANENSIS: NOT DETECTED
ENTAMOEBA HISTOLYTICA: NOT DETECTED
ENTEROAGGREGATIVE E. COLI (EAEC): NOT DETECTED
ENTEROPATHOGENIC E COLI (EPEC): NOT DETECTED
ENTEROTOXIGENIC E COLI (ETEC) LT/ST: NOT DETECTED
GIARDIA LAMBLIA: NOT DETECTED
NOROVIRUS GI/GII: NOT DETECTED
PLESIOMONAS SHIGELLOIDES: NOT DETECTED
ROTAVIRUS A: NOT DETECTED
SALMONELLA SPECIES: NOT DETECTED
SAPOVIRUS: NOT DETECTED
SHIGA-LIKE TOXIN-PRODUCING E COLI (STEC) STX1/STX2: NOT DETECTED
SHIGELLA/ENTEROINVASIVE E COLI (EIEC): NOT DETECTED
VIBRIO CHOLERAE: NOT DETECTED
VIBRIO: NOT DETECTED
YERSINIA ENTEROCOLITICA: NOT DETECTED

## 2024-12-21 LAB — SHORT RESPIRATORY PANEL
HMPV: NEGATIVE
INFLUENZA A RNA: NEGATIVE
INFLUENZA B RNA: NEGATIVE
RSV: NEGATIVE
SARS-CoV-2: NEGATIVE

## 2024-12-21 LAB — CBC WITH DIFF
BASOPHIL #: <0.1 x10ˆ3/uL (ref <=0.20)
BASOPHIL %: 0.5 %
EOSINOPHIL #: 0.17 x10ˆ3/uL (ref <=0.50)
EOSINOPHIL %: 2.1 %
HCT: 47.4 % — ABNORMAL HIGH (ref 34.8–46.0)
IMMATURE GRANULOCYTE %: 0.4 % (ref 0.0–1.0)
LYMPHOCYTE #: 2.96 x10ˆ3/uL (ref 1.00–4.80)
LYMPHOCYTE %: 36.7 %
MCV: 87.9 fL (ref 78.0–100.0)
MONOCYTE #: 0.46 x10ˆ3/uL (ref 0.20–1.10)
MONOCYTE %: 5.7 %
NEUTROPHIL #: 4.4 x10ˆ3/uL (ref 1.50–7.70)
NEUTROPHIL %: 54.6 %
RDW-CV: 13.5 % (ref 11.5–15.5)
WBC: 8.1 x10ˆ3/uL (ref 3.7–11.0)

## 2024-12-21 LAB — COMPREHENSIVE METABOLIC PANEL, NON-FASTING
ALKALINE PHOSPHATASE: 84 U/L (ref 35–129)
ANION GAP: 11 mmol/L (ref 7–18)
BILIRUBIN TOTAL: 0.3 mg/dL (ref 0.2–1.2)
CALCIUM: 9 mg/dL (ref 8.3–10.7)
CREATININE: 1.06 mg/dL — ABNORMAL HIGH (ref 0.50–0.90)
ESTIMATED GFR: 62 mL/min/1.73mˆ2 — ABNORMAL LOW (ref >90)
POTASSIUM: 4.1 mmol/L (ref 3.2–5.0)
SODIUM: 140 mmol/L (ref 133–144)

## 2024-12-21 LAB — LIPID PANEL
CHOLESTEROL: 124 mg/dL (ref 0–200)
HDL CHOL: 47 mg/dL
LDL CALC: 60 mg/dL (ref 0–99)
TRIGLYCERIDES: 88 mg/dL (ref 0–150)
VLDL CALC: 17 mg/dL (ref 0–30)

## 2024-12-21 LAB — LIPASE: LIPASE: 40 U/L (ref 13–60)

## 2024-12-21 LAB — LACTIC ACID LEVEL W/ REFLEX FOR LEVEL >2.0: LACTIC ACID: 0.8 mmol/L (ref 0.5–2.0)

## 2024-12-21 MED ORDER — IOPAMIDOL 300 MG IODINE/ML (61 %) INTRAVENOUS SOLUTION
100.0000 mL | INTRAVENOUS | Status: AC
  Administered 2024-12-21: 100 mL via INTRAVENOUS

## 2024-12-21 MED ORDER — DICYCLOMINE 20 MG TABLET: 20.0000 mg | ORAL_TABLET | Freq: Four times a day (QID) | ORAL | 0 refills | Status: AC

## 2024-12-21 NOTE — ED Nurses Note (Signed)
 Reassessment completed. No change in condition. No needs voice at this time.

## 2024-12-21 NOTE — ED Triage Notes (Signed)
 [facility] - Emergency Department    Andrea Summers is a 59 y.o. female presenting to the ED today for:  Diarrhea    Additional History:     Patient in the emergency department today with complaints of 4 day history of diarrhea.  Reports continuous diarrhea for the past 4 days.  Reports mild abdominal pain.  Denies any fevers or chills, denies any chest pain or shortness of breath.      Pertinent Exam findings:  Filed Vitals:    12/21/24 1313   BP: 131/84   Pulse: 75   Resp: 12   Temp: 36.9 C (98.4 F)   SpO2: 99%       Gen: Nontoxic in appearance.  Head: NC/AT  Neck: Trachea midline. No gross meningismus.  Psych: Denis SI/HI.    Assessment: Medical Screening Exam.    Plan/MDM at Triage:  I have considered labs, imaging, EKG.  These have been ordered as clinically indicated.  I have considered that the patient may need medications.  However, due to treatment space limitation, medications considered have to be appropriate for the waiting room.    I have considered the patient's chronic conditions and have counseled the patient that we will be evaluating for the presence of an emergency medical condition today in which their chronic conditions may or may not play a role.  I have advised that the patient should notify their PCP of today's visit regardless of disposition.  I have considered the patient's social determinants of health and health care literacy while determining the patient's initial plan of care.  I have further advised that the patient will possibly see a 2nd emergency provider today during their stay to help promote throughput.    I have performed an initial medical screening evaluation in order to determine if the patient has an emergency medical condition. The patient has been evaluated.  Imminent life, limb, or sight threatening emergency will be taken to the treatment area immediately. The patient's orders will be reviewed by nursing staff and placed  in the treatment area as soon as possible as bed capacity allows.  Should the patient's status change, or a new sign/symptom develop, immediate notification of nursing or ED staff has been advised.

## 2024-12-21 NOTE — ED Nurses Note (Signed)

## 2024-12-21 NOTE — ED Provider Notes (Signed)
 [facility] - Emergency Department  Emergency Department Physician Note      Arrival: Car  12/21/2024 23:35      Chief Complaint:    Chief Complaint   Patient presents with    Diarrhea     Pt reports that she has had severe diarrhea since 11/17/24. Denies any abd pain. States that anything she eats or drinks causes immediate diarrhea.     Andrea Summers is a 59 y.o. female who had concerns including Diarrhea.    History of Present Illness:  59 year old female with a history of diabetes on Mounjaro, chronic kidney disease, hypertension, hyperlipidemia and obstructive sleep apnea.  She presents to emergency room for evaluation of diarrhea since Tuesday, reports 10-12 nonbloody episodes, she states it looks like muddy water .  She reports associated intermittent abdominal cramping.  No fever, no chills, no chest pain, no shortness of breath, no nausea, vomiting or urinary symptoms.  She started taking Mounjaro 2.5 mg 4 weeks ago with most recent dosing being last Tuesday.  She is able to tolerate liquid IV.    PMH/PSH/FH/SH:    Past Medical History:   Diagnosis Date    Cavernous sinus tumor (CMS HCC)     CKD (chronic kidney disease)     Essential hypertension     Hyperlipidemia     OSA (obstructive sleep apnea)     with CPAP       Past Surgical History:   Procedure Laterality Date    Brain tumor excision      Cesarean section      Hx breast biopsy Bilateral     Hx cholecystectomy      Hx hysterectomy      Hx tubal ligation         Family History   Problem Relation Age of Onset    Cancer Father     Heart Attack Maternal Grandfather        Social History     Socioeconomic History    Marital status: Married     Spouse name: Not on file    Number of children: Not on file    Years of education: Not on file    Highest education level: Not on file   Occupational History    Not on file   Tobacco Use    Smoking status: Former     Current packs/day: 0.00     Average packs/day: 0.9 packs/day for 42.0 years (38.0  ttl pk-yrs)     Types: Cigarettes     Start date: 59     Quit date: 2025     Years since quitting: 1.0     Passive exposure: Past    Smokeless tobacco: Never   Vaping Use    Vaping status: Former    Start date: 12/14/2019    Quit date: 04/12/2024    Passive vaping exposure: Yes   Substance and Sexual Activity    Alcohol use: Never    Drug use: Never    Sexual activity: Not on file   Other Topics Concern    Not on file   Social History Narrative    Not on file     Social Determinants of Health     Financial Resource Strain: Not on file   Transportation Needs: Not on file   Social Connections: Not on file   Intimate Partner Violence: Not on file   Housing Stability: Not on file       Meds/Allergies:  Current Outpatient Medications   Medication Instructions    albuterol  sulfate (PROVENTIL  OR VENTOLIN  OR PROAIR ) 90 mcg/actuation Inhalation oral inhaler 1-2 Puffs, Inhalation, EVERY 4 HOURS PRN    dicyclomine  (BENTYL ) 20 mg, Oral, 4 TIMES DAILY    escitalopram oxalate (LEXAPRO) 10 mg Oral Tablet     ipratropium-albuterol  0.5 mg-3 mg(2.5 mg base)/3 mL Solution for Nebulization INHALE 3 ML BY NEBULIZATION 4 TIMES A DAY AS NEEDED    metFORMIN (GLUCOPHAGE) 500 mg Oral Tablet     Mounjaro 2.5 mg, EVERY 7 DAYS    naproxen  (NAPROSYN ) 500 mg, Oral, 2 TIMES DAILY PRN    rOPINIRole (REQUIP) 1 mg Oral Tablet 1 Tablet, NIGHTLY    rosuvastatin (CRESTOR) 20 mg Oral Tablet 1 Tablet, Daily    SPIRIVA  RESPIMAT 2.5 mcg/actuation Inhalation oral inhaler     topiramate (TOPAMAX) 50 mg Oral Tablet 1 Tablet, NIGHTLY    traZODone (DESYREL) 100 mg Oral Tablet 1 Tablet, Daily    varenicline  tartrate (CHANTIX ) 1 mg, Oral, 2 TIMES DAILY      Allergies[1]    Physical Exam:    ED Triage Vitals [12/21/24 1313]   BP (Non-Invasive) 131/84   Heart Rate 75   Respiratory Rate 12   Temperature 36.9 C (98.4 F)   SpO2 99 %   Weight 87.5 kg (193 lb)   Height 1.575 m (5' 2)     Physical Exam     General:  No apparent distress, well appearing.  Skin:  Skin is  warm, dry, and intact.    Eyes: Pupils are equally round, extraocular movements intact without nystagmus, clear conjunctiva, non icteric sclera.   HENT:  Normocephalic, atraumatic, moist mucous membranes, oropharynx clear without exudates.  Neck:  Nontender and supple with no nuchal rigidity, no lymphadenopathy, full range of motion  Pulmonary:  Clear to auscultation without wheezes, rhonchi or rales.  Normal excursion, no accessory muscle use and no stridor  Cardiovascular: Regular rate, regular rhythm. No murmurs, rubs, or gallops. Intact distal pulses.  Abdomen: Abdomen is soft, bowel sounds present throughout, nontender, and non-distended. No rebound. No guarding.    Musculoskeletal:  Extremities are nontender to palpation and have no gross deformity, no edema, no redness, or swelling.  Neurological: Alert and oriented x 3, GCS 15, normal mentation and speech. Moves all 4 extremities. No obvious gross cranial nerve deficit.  Psychiatric:  Normal mood and affect, thought process is clear and linear    Results:    Labs Ordered/Reviewed   COMPREHENSIVE METABOLIC PANEL, NON-FASTING - Abnormal; Notable for the following components:       Result Value    CHLORIDE 109 (*)     CO2 TOTAL 20 (*)     CREATININE 1.06 (*)     ESTIMATED GFR 62 (*)     AST (SGOT) 33 (*)     All other components within normal limits   CBC WITH DIFF - Abnormal; Notable for the following components:    RBC 5.39 (*)     HCT 47.4 (*)     All other components within normal limits   GI PANEL BY BIOFIRE FILM ARRAY - Normal    Narrative:     The results of this test should not be used as the sole basis for diagnosis, treatment, or other management decisions.   LACTIC ACID LEVEL W/ REFLEX FOR LEVEL >2.0 - Normal   LIPASE - Normal   SHORT RESPIRATORY PANEL - Normal   CBC/DIFF    Narrative:  The following orders were created for panel order CBC/DIFF.  Procedure                               Abnormality         Status                     ---------                                -----------         ------                     CBC WITH IPQQ[[id number]]                Abnormal            Final result                 Please view results for these tests on the individual orders.   URINALYSIS,WITH REFLEX MICROSCOPIC AND REFLEX CULTURE IF POSITIVE    Narrative:     The following orders were created for panel order URINALYSIS WITH REFLEX MICROSCOPIC AND CULTURE IF POSITIVE.  Procedure                               Abnormality         Status                     ---------                               -----------         ------                     URINALYSIS, MACRO/MICRO[[id number]]                                                       Please view results for these tests on the individual orders.   URINALYSIS, MACRO/MICRO       CT ABDOMEN PELVIS W IV CONTRAST   Final Result by Edi, Radresults In (01/09 1633)      1. Hepatic steatosis.            Radiologist location ID: [facility]             ED Course:    ED Course as of 12/21/24 2335   [place] Dec 21, 2024   2332 CBC/DIFF(!)  Unremarkable   2332 COMPREHENSIVE METABOLIC PANEL, NON-FASTING(!)  There is a mild elevation in chloride and creatinine however remains unremarkable.   2332 LACTIC ACID: 0.8   2333 GI PANEL BY BIOFIRE FILM ARRAY  Negative   2333 SHORT RESPIRATORY PANEL  Negative   2333 CT ABDOMEN PELVIS W IV CONTRAST  Hepatic steatosis       Medications Ordered/Administered in the ED   iopamidol  (ISOVUE -300) 61% solution (100 mL Intravenous Given 12/21/24 1614)       MDM:   Pleasant 59 year old female with a history of diabetes on Monjauro that presents to  emergency room for evaluation of intermittent abdominal cramping associated with 10-12 episodes of nonbloody diarrhea since Tuesday.  In the setting of starting Mounjaro 2.5 mg for weeks ago with the most recent dose being last Tuesday.  Labs overall non-remarkable, no leukocytosis, no lactic acidosis, GI panel by BioFire film array negative.  Respiratory panel negative as  well.  Patient underwent CT scan abdomen and pelvis which revealed hepatic steatosis and no other acute findings.  I suspect likely due to Mounjaro side effect.  We will prescribe Bentyl  for intermittent abdominal cramping, patient is to follow up with the primary care provider within 1 week, we discussed return precautions, she verbalized understanding and agreed.    Differential diagnosis:  Gastroenteritis, viral syndrome, medication side effect    Medical Decision Making      Impression:    Clinical Impression   Diarrhea, unspecified type (Primary)   Abdominal cramping   Hepatic steatosis         Disposition:   Discharged        Part of this note may have been generated using voice recognition software.  Be advised, it is possible that the generated note may be prone to syntax and other dictation software errors.         [1] No Known Allergies

## 2024-12-21 NOTE — H&P (Addendum)
 Cardiology [facility], Medical Office Building [place]  [address]  [place] [address]  [phone]    Cardiology  Follow-up Clinic Note    Name: Andrea Summers   DOB: November 17, 1966  [58 y.o. female]   MRN: [id number]       Visit Date: 12/21/2024   Referring: No referring provider defined for this encounter.   PCP: Cathlyn Candy, PA         Reason for Visit:  Follow-up visit with cardiology, preop cardiac evaluation  Chief Complaint:   Chief Complaint   Patient presents with    Follow Up       History of Present Illness from the last visit:  Andrea Summers is a 59 y.o. female with past medical history of hypertension, hyperlipidemia, OSA (On CPAP), former smoker, CVA/TIA, CKD, knee arthritis presents for preop cardiac evaluation for upcoming knee surgery (reports would be around January February next year).    Reports being independent in terms of activities of daily living and does cooking, cleaning, groceries by herself.  However, does not indulge in strenuous activities. Denies any shortness of breath, chest discomfort/pain, lightheadedness, symptoms suggestive of orthopnea/PND, leg swelling.  Denies any concerns for bleeding from anywhere including hemoptysis/hematemesis, denies change in color of urine or stools or any other bowel or bladder trouble.  Denies any numbness/tingling/weakness anywhere and face/limbs.    Reports compliance with CPAP.  Also on Crestor 20 mg daily.  No lipid panel on file.  Recently had an A1c elevated at 6.8, we will started on metformin 500 mg daily.    Reports quitting smoking in May this year (previously smoked 0.5 packs per day for about 2030 years).  Denies any regular alcohol use.    Denies any family history of premature atherosclerotic cardiovascular disease.    Reports having a stress test 5 6 years ago that was reportedly normal.    Interval history:  Patient denies any new complaints since last clinic visit.  She underwent stress test  as part of preop cardiovascular risk stratification that came back positive for medium size apical to mid inferior, inferolateral and apical defects with partial reversibility. Denies any chest pain/pressure, shortness of breath, palpitations, lightheadedness or syncopal events.  Denies any obvious bleeding from anywhere in stool, urine, skin.  She had been maintaining blood pressure logs and reports them being in systolic 120s.    We discussed next steps in terms of left heart catheterization which she is in agreement to proceed with after a detailed risk versus benefit discussion (as documented below).      No Known Allergies    Medications:  Current Outpatient Medications   Medication Sig    albuterol  sulfate (PROVENTIL  OR VENTOLIN  OR PROAIR ) 90 mcg/actuation Inhalation oral inhaler Take 1-2 Puffs by inhalation Every 4 hours as needed    escitalopram oxalate (LEXAPRO) 10 mg Oral Tablet     ipratropium-albuterol  0.5 mg-3 mg(2.5 mg base)/3 mL Solution for Nebulization INHALE 3 ML BY NEBULIZATION 4 TIMES A DAY AS NEEDED    metFORMIN (GLUCOPHAGE) 500 mg Oral Tablet     MOUNJARO 2.5 mg/0.5 mL Subcutaneous Pen Injector Inject 0.5 mL (2.5 mg total) under the skin Every 7 days    naproxen  (NAPROSYN ) 500 mg Oral Tablet Take 1 Tablet (500 mg total) by mouth Twice per day as needed    rOPINIRole (REQUIP) 1 mg Oral Tablet Take 1 Tablet (1 mg total) by mouth Every night (Patient not taking:  Reported on 12/21/2024)    rosuvastatin (CRESTOR) 20 mg Oral Tablet Take 1 Tablet (20 mg total) by mouth Daily    SPIRIVA  RESPIMAT 2.5 mcg/actuation Inhalation oral inhaler     topiramate (TOPAMAX) 50 mg Oral Tablet Take 1 Tablet (50 mg total) by mouth Every night    traZODone (DESYREL) 100 mg Oral Tablet Take 1 Tablet (100 mg total) by mouth Daily    varenicline  tartrate (CHANTIX ) 1 mg Oral Tablet TAKE 1 TABLET BY MOUTH TWICE A DAY       Patient History:  Past Medical History:   Diagnosis Date    Cavernous sinus tumor (CMS HCC)     CKD  (chronic kidney disease)     Essential hypertension     Hyperlipidemia     OSA (obstructive sleep apnea)     with CPAP         Past Surgical History:   Procedure Laterality Date    BRAIN TUMOR EXCISION      2007 and 2008 causing mini strokes    CESAREAN SECTION      1992    HX BREAST BIOPSY Bilateral     HX CHOLECYSTECTOMY      2005    HX HYSTERECTOMY      Total    HX TUBAL LIGATION           Family Medical History:       Problem Relation (Age of Onset)    Cancer Father    Heart Attack Maternal Grandfather            Social History     Tobacco Use    Smoking status: Former     Current packs/day: 0.00     Average packs/day: 0.9 packs/day for 42.0 years (38.0 ttl pk-yrs)     Types: Cigarettes     Start date: 38     Quit date: 2025     Years since quitting: 1.0     Passive exposure: Past    Smokeless tobacco: Never   Vaping Use    Vaping status: Former    Start date: 12/14/2019    Quit date: 04/12/2024    Passive vaping exposure: Yes   Substance Use Topics    Alcohol use: Never    Drug use: Never        Review of Systems:  As noted in the HPI, otherwise unremarkable.      Physical Exam:  Constitutional: AA&O X3 Well developed and well nourished in no acute distress  Eyes: Conjunctiva clear, Pupils equal, round and reactive to light  HENT: Head is normocephalic, atraumatic   Neck: Normal ROM, Supple, symmetrical  Respiratory: Effort normal, clear to auscultation bilaterally.  Cardiovascular: regular rate and rhythm, S1, S2 normal, no murmur, click, rub or gallop  Gastrointestinal: Bowel sounds normal; soft, non distended non-tender to palpation  Extremities: extremities normal, atraumatic, no cyanosis or edema  Integumentary:  Skin warm and dry  Neurologic: Grossly normal, no focal neuro deficit, normal coordination and gait  Psychiatric: normal affect and speech.     Data Reviewed:  I have reviewed the patient's labs.  Pertinent results are as follows:    ECG - NSR, Anterior precordial TWI, slightly changed from EKG last  week on 10/26/2024  Echo - N/A  Stress Test 12/20/2023-     Abnormal regadenoson  SPECT - Sestamibi myocardial perfusion study.    Clinically negative and electrocardiographically equivocal regadenoson  ECG portion of the study.    Stress Function  Comments: Left ventricular function post-stress is normal. Stress ejection fraction is 65%.    Perfusion Defect: Abnormal imaging study.There is a left ventricular perfusion defect that is medium in size with minimal intensity present in the apical to mid inferior, inferolateral and apex location(s) that is partially reversible. There is normal wall motion in the defect area. The defect appears to be probable artifact caused by subdiaphragmatic activity. The possibility of ischemia cannot be excluded.  Cath - N/A  Cardiac CT - N/A  Cardiac MRI - N/A    Lab work done previously was reviewed and included hemoglobin 14.3, white count 7.3, platelets 215, creatinine 1.01, potassium 4.1, sodium 139, TSH 2.3, vitamin-D 41.5, hemoglobin A1c 6.9 on 10/26/2024.    Assessment and Plan:      ICD-10-CM    1. Abnormal stress test  R94.39       2. Hypertension  I10       3. Hyperlipidemia  E78.5 LIPID PANEL      4. Obesity  E66.9       5. Ex-cigarette smoker  Z87.891       6. Cerebrovascular accident (CVA)  I63.9           Orders Placed This Encounter    LIPID PANEL     -from physical activity standpoint, patient independent and asymptomatic with activities of daily living but does not indulge in exertional activity due to limitations from knee pain and arthritis.  Stress test as part of preop cardiovascular examination came back positive for partial reversible apical to mid inferior, inferolateral, and apical defect.    The risks, benefits and alternatives to coronary angiography and possible PCI were discussed.  Patient understands the risks include but are not limited to death, heart attack, stroke, damage to an artery/nerves/vein possibly requiring surgery to repair, bleeding,  infection, hematoma, pseudoaneurysm, reaction to IV sedation, exposure radiation, reaction to IV contrast, and IV contrast possibly requiring damage to the kidneys leading to possible dialysis.  Patient understands the risks are not all inclusive and wished to proceed.    We will arrange to schedule for left heart catheterization in coming weeks.  Previous lab work by PCP was reviewed as above.  We will need to repeat BMP on the day of testing.    -history of hypertension, blood pressure normal in the clinic today.  She has been maintaining blood pressure logs and Reports blood pressure at home in 120s/80s.  Advised to maintain blood pressure log for clinic visit next time.  Advised on low-salt diet.  -history of hyperlipidemia, on Crestor 20 mg daily.  Repeat lipid panel today.    -A1c elevated at 6.9, on metformin 500 mg daily by PCP.  -patient compliant with CPAP, encouraged to continue.  -counseled on continuing abstinence from tobacco use.  -consult on heart healthy diet including more vegetables and fruits and avoiding cholesterol rich food.    Return in about 6 months (around 06/20/2025) for In Person Visit.      Crystal Gain, MD  [facility]  Cardiology  [facility] Medicine

## 2024-12-21 NOTE — Discharge Instructions (Signed)
 Please follow-up with your primary care provider within 1 week for continuity of care.     IF YOU DO NOT HAVE A FAMILY DOCTOR YOU MAY CALL THE [facility] REFERRAL LINE AT [phone].       Return to the emergency room if you develop any new or worsening symptoms which include but are not limited to fever greater than 102 F, chest pain or shortness of breath.

## 2024-12-21 NOTE — ED Nurses Note (Signed)
 Pt started taking Monjauro 2.5mg  4 weeks ago. This weekend should be her 5th dose.

## 2024-12-24 ENCOUNTER — Encounter (INDEPENDENT_AMBULATORY_CARE_PROVIDER_SITE_OTHER): Payer: Self-pay

## 2024-12-24 ENCOUNTER — Other Ambulatory Visit (INDEPENDENT_AMBULATORY_CARE_PROVIDER_SITE_OTHER): Payer: Self-pay

## 2024-12-24 DIAGNOSIS — R9439 Abnormal result of other cardiovascular function study: Secondary | ICD-10-CM | POA: Insufficient documentation

## 2024-12-25 ENCOUNTER — Encounter (INDEPENDENT_AMBULATORY_CARE_PROVIDER_SITE_OTHER): Payer: Self-pay

## 2024-12-25 NOTE — Nursing Note (Signed)
 Patient is scheduled for a LHC on January 09, 2025.  I spoke with patient and patient was instructed to stop taking Mounjaro 1 week prior to cath.  Instructions were mailed.  Suzen Rattler

## 2025-01-03 NOTE — Nursing Note (Addendum)
 LHC has been rescheduled to January 24, 2025 patient to arrive at registration at 8:30 a.m.  PAT is scheduled on January 22, 2025 at 8:30 a.m.  I spoke with patient.  Patient instructed to stop Mounjaro 1 week prior to cath.

## 2025-01-04 NOTE — Nursing Note (Signed)
 LHC has been rescheduled to January 28, 2025 patient to arrive at registration at 7:00 a.m. PAT is scheduled on January 24, 2025 at 8:30 a.m. I spoke with patient. Patient instructed to stop Mounjaro 1 week prior to cath.

## 2025-01-09 DIAGNOSIS — R9439 Abnormal result of other cardiovascular function study: Secondary | ICD-10-CM | POA: Insufficient documentation

## 2025-01-22 ENCOUNTER — Ambulatory Visit (INDEPENDENT_AMBULATORY_CARE_PROVIDER_SITE_OTHER): Payer: Self-pay | Admitting: ORTHOPAEDIC SURGERY

## 2025-01-24 DIAGNOSIS — R9439 Abnormal result of other cardiovascular function study: Secondary | ICD-10-CM | POA: Insufficient documentation

## 2025-01-28 ENCOUNTER — Ambulatory Visit: Admit: 2025-01-28

## 2025-01-28 ENCOUNTER — Encounter (HOSPITAL_COMMUNITY): Payer: Self-pay

## 2025-01-28 DIAGNOSIS — R9439 Abnormal result of other cardiovascular function study: Secondary | ICD-10-CM | POA: Insufficient documentation

## 2025-04-03 ENCOUNTER — Ambulatory Visit: Admitting: Dermatology

## 2025-05-08 ENCOUNTER — Ambulatory Visit (INDEPENDENT_AMBULATORY_CARE_PROVIDER_SITE_OTHER): Payer: Self-pay | Admitting: NURSE PRACTITIONER

## 2025-05-30 ENCOUNTER — Encounter: Admitting: Family

## 2025-06-21 ENCOUNTER — Ambulatory Visit (INDEPENDENT_AMBULATORY_CARE_PROVIDER_SITE_OTHER): Payer: Self-pay
